# Patient Record
Sex: Male | Born: 1950 | Race: White | Hispanic: No | Marital: Single | Smoking: Never smoker
Health system: Southern US, Community
[De-identification: ages and names within clinical notes are randomized; demographics above are authoritative.]

## PROBLEM LIST (undated history)

## (undated) DIAGNOSIS — J45909 Unspecified asthma, uncomplicated: Secondary | ICD-10-CM

## (undated) DIAGNOSIS — J939 Pneumothorax, unspecified: Secondary | ICD-10-CM

## (undated) DIAGNOSIS — R011 Cardiac murmur, unspecified: Secondary | ICD-10-CM

## (undated) DIAGNOSIS — C61 Malignant neoplasm of prostate: Secondary | ICD-10-CM

## (undated) DIAGNOSIS — K219 Gastro-esophageal reflux disease without esophagitis: Secondary | ICD-10-CM

## (undated) HISTORY — PX: EYE SURGERY: SHX253

## (undated) HISTORY — DX: Gastro-esophageal reflux disease without esophagitis: K21.9

## (undated) HISTORY — PX: LUNG SURGERY: SHX703

## (undated) HISTORY — PX: PROSTATE BIOPSY: SHX241

## (undated) HISTORY — DX: Unspecified asthma, uncomplicated: J45.909

---

## 1999-04-20 DIAGNOSIS — J939 Pneumothorax, unspecified: Secondary | ICD-10-CM

## 1999-04-20 HISTORY — PX: LUNG SURGERY: SHX703

## 1999-04-20 HISTORY — DX: Pneumothorax, unspecified: J93.9

## 2014-07-04 ENCOUNTER — Other Ambulatory Visit (HOSPITAL_COMMUNITY): Payer: Self-pay | Admitting: Family Medicine

## 2014-07-04 ENCOUNTER — Ambulatory Visit (HOSPITAL_COMMUNITY)
Admission: RE | Admit: 2014-07-04 | Discharge: 2014-07-04 | Disposition: A | Discharge status: Home / Self Care | Payer: Disability Insurance | Source: Ambulatory Visit | Attending: Family Medicine | Admitting: Family Medicine

## 2014-07-04 DIAGNOSIS — M542 Cervicalgia: Secondary | ICD-10-CM

## 2014-07-04 DIAGNOSIS — M5032 Other cervical disc degeneration, mid-cervical region: Secondary | ICD-10-CM | POA: Insufficient documentation

## 2014-07-04 DIAGNOSIS — M25512 Pain in left shoulder: Secondary | ICD-10-CM | POA: Insufficient documentation

## 2014-07-04 DIAGNOSIS — M25511 Pain in right shoulder: Secondary | ICD-10-CM

## 2019-03-31 ENCOUNTER — Emergency Department (HOSPITAL_COMMUNITY): Payer: Medicare Other

## 2019-03-31 ENCOUNTER — Encounter (HOSPITAL_COMMUNITY): Payer: Self-pay | Admitting: Emergency Medicine

## 2019-03-31 ENCOUNTER — Emergency Department (HOSPITAL_COMMUNITY)
Admission: EM | Admit: 2019-03-31 | Discharge: 2019-03-31 | Disposition: A | Discharge status: Home / Self Care | Payer: Medicare Other | Attending: Emergency Medicine | Admitting: Emergency Medicine

## 2019-03-31 ENCOUNTER — Other Ambulatory Visit: Payer: Self-pay

## 2019-03-31 DIAGNOSIS — W540XXA Bitten by dog, initial encounter: Secondary | ICD-10-CM | POA: Diagnosis not present

## 2019-03-31 DIAGNOSIS — Z2914 Encounter for prophylactic rabies immune globin: Secondary | ICD-10-CM | POA: Insufficient documentation

## 2019-03-31 DIAGNOSIS — Y939 Activity, unspecified: Secondary | ICD-10-CM | POA: Diagnosis not present

## 2019-03-31 DIAGNOSIS — Z23 Encounter for immunization: Secondary | ICD-10-CM | POA: Diagnosis not present

## 2019-03-31 DIAGNOSIS — Y929 Unspecified place or not applicable: Secondary | ICD-10-CM | POA: Diagnosis not present

## 2019-03-31 DIAGNOSIS — Z203 Contact with and (suspected) exposure to rabies: Secondary | ICD-10-CM | POA: Insufficient documentation

## 2019-03-31 DIAGNOSIS — S51832A Puncture wound without foreign body of left forearm, initial encounter: Secondary | ICD-10-CM | POA: Insufficient documentation

## 2019-03-31 DIAGNOSIS — S51831A Puncture wound without foreign body of right forearm, initial encounter: Secondary | ICD-10-CM | POA: Diagnosis not present

## 2019-03-31 DIAGNOSIS — R0789 Other chest pain: Secondary | ICD-10-CM | POA: Diagnosis not present

## 2019-03-31 DIAGNOSIS — Y999 Unspecified external cause status: Secondary | ICD-10-CM | POA: Insufficient documentation

## 2019-03-31 DIAGNOSIS — T148XXA Other injury of unspecified body region, initial encounter: Secondary | ICD-10-CM

## 2019-03-31 HISTORY — DX: Pneumothorax, unspecified: J93.9

## 2019-03-31 LAB — CBC WITH DIFFERENTIAL/PLATELET
Abs Immature Granulocytes: 0.01 10*3/uL (ref 0.00–0.07)
Basophils Absolute: 0.1 10*3/uL (ref 0.0–0.1)
Basophils Relative: 1 %
Eosinophils Absolute: 0.4 10*3/uL (ref 0.0–0.5)
Eosinophils Relative: 5 %
HCT: 47.7 % (ref 39.0–52.0)
Hemoglobin: 15.4 g/dL (ref 13.0–17.0)
Immature Granulocytes: 0 %
Lymphocytes Relative: 23 %
Lymphs Abs: 1.6 10*3/uL (ref 0.7–4.0)
MCH: 27.8 pg (ref 26.0–34.0)
MCHC: 32.3 g/dL (ref 30.0–36.0)
MCV: 86.1 fL (ref 80.0–100.0)
Monocytes Absolute: 0.5 10*3/uL (ref 0.1–1.0)
Monocytes Relative: 8 %
Neutro Abs: 4.5 10*3/uL (ref 1.7–7.7)
Neutrophils Relative %: 63 %
Platelets: 270 10*3/uL (ref 150–400)
RBC: 5.54 MIL/uL (ref 4.22–5.81)
RDW: 13 % (ref 11.5–15.5)
WBC: 7.1 10*3/uL (ref 4.0–10.5)
nRBC: 0 % (ref 0.0–0.2)

## 2019-03-31 LAB — BASIC METABOLIC PANEL
Anion gap: 9 (ref 5–15)
BUN: 13 mg/dL (ref 8–23)
CO2: 23 mmol/L (ref 22–32)
Calcium: 9.3 mg/dL (ref 8.9–10.3)
Chloride: 103 mmol/L (ref 98–111)
Creatinine, Ser: 0.9 mg/dL (ref 0.61–1.24)
GFR calc Af Amer: >60 mL/min (ref >60)
GFR calc non Af Amer: >60 mL/min (ref >60)
Glucose, Bld: 95 mg/dL (ref 70–99)
Potassium: 4.1 mmol/L (ref 3.5–5.1)
Sodium: 135 mmol/L (ref 135–145)

## 2019-03-31 LAB — TROPONIN I (HIGH SENSITIVITY)
Troponin I (High Sensitivity): 5 ng/L (ref <18)
Troponin I (High Sensitivity): 5 ng/L (ref <18)

## 2019-03-31 LAB — D-DIMER, QUANTITATIVE: D-Dimer, Quant: 0.52 ug/mL-FEU — ABNORMAL HIGH (ref 0.00–0.50)

## 2019-03-31 MED ORDER — TETANUS-DIPHTH-ACELL PERTUSSIS 5-2.5-18.5 LF-MCG/0.5 IM SUSP
0.5000 mL | Freq: Once | INTRAMUSCULAR | Status: AC
  Administered 2019-03-31: 12:00:00 0.5 mL via INTRAMUSCULAR
  Filled 2019-03-31: qty 0.5

## 2019-03-31 MED ORDER — AMOXICILLIN-POT CLAVULANATE 875-125 MG PO TABS
1.0000 | ORAL_TABLET | Freq: Once | ORAL | Status: AC
  Administered 2019-03-31: 1 via ORAL
  Filled 2019-03-31: qty 1

## 2019-03-31 MED ORDER — AMOXICILLIN-POT CLAVULANATE 875-125 MG PO TABS: 1.0000 | ORAL_TABLET | Freq: Two times a day (BID) | ORAL | 0 refills | Status: DC

## 2019-03-31 MED ORDER — RABIES IMMUNE GLOBULIN 150 UNIT/ML IM INJ
20.0000 [IU]/kg | INJECTION | Freq: Once | INTRAMUSCULAR | Status: AC
  Administered 2019-03-31: 12:00:00 2100 [IU] via INTRAMUSCULAR
  Filled 2019-03-31: qty 14

## 2019-03-31 MED ORDER — RABIES VACCINE, PCEC IM SUSR
1.0000 mL | Freq: Once | INTRAMUSCULAR | Status: AC
  Administered 2019-03-31: 12:00:00 1 mL via INTRAMUSCULAR
  Filled 2019-03-31: qty 1

## 2019-03-31 NOTE — ED Notes (Signed)
Awaiting trop results

## 2019-03-31 NOTE — ED Notes (Signed)
Pt reports he waited 2 hours yesterday at [facility] and left  Was moving a shed and a pitbull "mauled" him   dressings in place to bilateral arms  Pt in NAD   States awakened w CP this am, NSR on monitor and normal EKG

## 2019-03-31 NOTE — ED Notes (Addendum)
Pt reports animal bit him at [address] , [place], [place] control notified in [place] thru [place] has wounds to bilateral forearms - a long superficial scratch to the left forearm with a puncture wound to upper forearm and lower forearm   As well as a single puncture also to the R forearm upper and lower as if dog bit both forearms with penetration of upper and lower tooth (singular) to each arm

## 2019-03-31 NOTE — Discharge Instructions (Signed)
Take the antibiotics as prescribed.  There is no evidence of damage to your heart and your pain is likely coming from your chest wall.  Follow-up with your primary doctor or urgent care for your repeat rabies vaccinations as needed by the schedule below.  Take the antibiotics as prescribed.  Return to the ED with new or worsening symptoms.                                    RABIES VACCINE FOLLOW UP  Patient's Name: Leon Hawkins                     Original Order Date:03/31/2019  Medical Record Number: [id number]  ED Physician: Ezequiel Essex, MD Primary Diagnosis: Rabies Exposure       PCP: Patient, No Pcp Per  Patient Phone Number: (home) [phone] (home)    (cell)  No relevant phone numbers on file.    (work) There is no work phone number on file. Species of Animal:     You have been seen in the Emergency Department for a possible rabies exposure. It's very important you return for the additional vaccine doses.  Please call the clinic listed below for hours of operation.   Clinic that will administer your rabies vaccines:    DAY 0:  03/31/2019      DAY 3:  04/03/2019       DAY 7:  04/07/2019     DAY 14:  04/14/2019         The 5th vaccine injection is considered for immune compromised patients only.  DAY 28:  04/28/2019

## 2019-03-31 NOTE — ED Provider Notes (Signed)
[facility] EMERGENCY DEPARTMENT Provider Note   CSN: [id number] Arrival date & time: 03/31/19  1052     History Chief Complaint  Patient presents with  . Animal Bite    Leon Hawkins is a 68 y.o. male.  Patient here with animal bites to his bilateral arms.  States he was "mauledby a Dealer" yesterday around noon.  He went to an outside hospital where he waited for several hours and left without being seen.  He came in today because he developed some left-sided chest pain overnight in the left side of his chest that has been fairly constant.  Is worse with palpation.  It does not radiate.  There is no associated shortness of breath, cough or fever.  No abdominal pain, nausea, vomiting. Patient denies any cardiac history.  He states he has pneumothorax remotely from a chemical exposure.  He is never had a heart attack.  The pain is in the left side of his chest worse with palpation.  There is no associated shortness of breath, fever, cough, nausea or dizziness.  He is not up-to-date on his tetanus shot.  He states he did speak to the owner of the pit bull and the dog is not up-to-date on rabies.  Animal control was not alerted.  He has multiple wounds to his bilateral forearms with bleeding controlled.  No focal weakness, numbness, tingling.  The history is provided by the patient.  Animal Bite Associated symptoms: no fever        Past Medical History:  Diagnosis Date  . Pneumothorax     There are no problems to display for this patient.   Past Surgical History:  Procedure Laterality Date  . LUNG SURGERY         Family History  Problem Relation Age of Onset  . Cancer Mother   . Heart failure Father   . Hypertension Father   . Diabetes Sister     Social History   Tobacco Use  . Smoking status: Never Smoker  . Smokeless tobacco: Never Used  Substance Use Topics  . Alcohol use: Never  . Drug use: Never    Home Medications Prior to Admission medications     Not on File    Allergies    Patient has no known allergies.  Review of Systems   Review of Systems  Constitutional: Negative for activity change, appetite change and fever.  HENT: Negative for congestion and rhinorrhea.   Respiratory: Positive for chest tightness. Negative for shortness of breath.   Cardiovascular: Positive for chest pain.  Gastrointestinal: Negative for abdominal pain, nausea and vomiting.  Genitourinary: Negative for dysuria and hematuria.  Musculoskeletal: Positive for arthralgias and myalgias.  Skin: Positive for wound.  Neurological: Negative for dizziness, weakness and headaches.   all other systems are negative except as noted in the HPI and PMH.    Physical Exam Updated Vital Signs BP (!) 165/111 (BP Location: Left Arm)   Pulse 64   Temp 97.9 F (36.6 C) (Oral)   Resp (!) 24   Ht 5\' 10"  (1.778 m)   Wt 106.6 kg   SpO2 99%   BMI 33.72 kg/m   Physical Exam Vitals and nursing note reviewed.  Constitutional:      General: He is not in acute distress.    Appearance: He is well-developed. He is obese.  HENT:     Head: Normocephalic and atraumatic.     Mouth/Throat:     Pharynx: No oropharyngeal exudate.  Eyes:     Conjunctiva/sclera: Conjunctivae normal.     Pupils: Pupils are equal, round, and reactive to light.  Neck:     Comments: No meningismus. Cardiovascular:     Rate and Rhythm: Normal rate and regular rhythm.     Heart sounds: Normal heart sounds. No murmur.  Pulmonary:     Effort: Pulmonary effort is normal. No respiratory distress.     Breath sounds: Normal breath sounds.     Comments: Reproducible left chest wall tenderness Chest:     Chest wall: Tenderness present.  Abdominal:     Palpations: Abdomen is soft.     Tenderness: There is no abdominal tenderness. There is no guarding or rebound.  Musculoskeletal:        General: Tenderness and signs of injury present. Normal range of motion.     Cervical back: Normal range of  motion and neck supple.     Comments: Bilateral forearms with multiple puncture wounds as depicted.  Bleeding is controlled.  Full range of motion of all fingers, bilateral wrists and elbows. Radial pulses are intact.  Skin:    General: Skin is warm.     Capillary Refill: Capillary refill takes less than 2 seconds.  Neurological:     General: No focal deficit present.     Mental Status: He is alert and oriented to person, place, and time. Mental status is at baseline.     Cranial Nerves: No cranial nerve deficit.     Motor: No abnormal muscle tone.     Coordination: Coordination normal.     Comments: No ataxia on finger to nose bilaterally. No pronator drift. 5/5 strength throughout. CN 2-12 intact.Equal grip strength. Sensation intact.   Psychiatric:        Behavior: Behavior normal.           ED Results / Procedures / Treatments   Labs (all labs ordered are listed, but only abnormal results are displayed) Labs Reviewed  D-DIMER, QUANTITATIVE (NOT AT [facility]) - Abnormal; Notable for the following components:      Result Value   D-Dimer, Quant 0.52 (*)    All other components within normal limits  CBC WITH DIFFERENTIAL/PLATELET  BASIC METABOLIC PANEL  TROPONIN I (HIGH SENSITIVITY)  TROPONIN I (HIGH SENSITIVITY)    EKG EKG Interpretation  Date/Time:  Saturday March 31 2019 11:24:04 EST Ventricular Rate:  63 PR Interval:    QRS Duration: 93 QT Interval:  402 QTC Calculation: 412 R Axis:   18 Text Interpretation: Sinus rhythm No previous ECGs available Confirmed by Ezequiel Essex [id number]) on 03/31/2019 11:28:56 AM   Radiology DG Forearm Left  Result Date: 03/31/2019 CLINICAL DATA:  Dog bite to the forearm. EXAM: LEFT FOREARM - 2 VIEW COMPARISON:  None. FINDINGS: There is no evidence of fracture or other focal bone lesions. Soft tissues are unremarkable. IMPRESSION: Negative radiographs of the left forearm. Electronically Signed   By: Audie Pinto M.D.   On:  03/31/2019 12:13   DG Forearm Right  Result Date: 03/31/2019 CLINICAL DATA:  Dog bite to the forearm. EXAM: RIGHT FOREARM - 2 VIEW COMPARISON:  None. FINDINGS: There is no evidence of fracture or other focal bone lesions. There is a small soft tissue defect in the mid aspect of the forearm which may correspond to the patient's dog bite. No unexpected radiopaque foreign body identified. IMPRESSION: Soft tissue defect in the mid aspect of the forearm without evidence of underlying bony abnormality. No unexpected radiopaque foreign  body. Electronically Signed   By: Audie Pinto M.D.   On: 03/31/2019 12:11   DG Chest Portable 1 View  Result Date: 03/31/2019 CLINICAL DATA:  Dog bite. Midsternal migrating Robin in the chest. EXAM: PORTABLE CHEST 1 VIEW COMPARISON:  None. FINDINGS: The heart size and mediastinal contours are within normal limits. There are mild linear opacities in the bilateral lung bases which could reflect atelectasis or scarring. No focal opacities to suggest infection. No pneumothorax or large pleural effusion. No acute finding in the visualized skeleton. The visualized skeletal structures are unremarkable. IMPRESSION: Mild linear opacities in the bilateral lung bases may reflect atelectasis or scarring. Electronically Signed   By: Audie Pinto M.D.   On: 03/31/2019 12:10    Procedures Procedures (including critical care time)  Medications Ordered in ED Medications  Tdap (BOOSTRIX) injection 0.5 mL (0.5 mLs Intramuscular Given 03/31/19 1211)  amoxicillin-clavulanate (AUGMENTIN) 875-125 MG per tablet 1 tablet (1 tablet Oral Given 03/31/19 1211)  rabies immune globulin (HYPERAB/KEDRAB) injection 2,100 Units (2,100 Units Intramuscular Given 03/31/19 1219)  rabies vaccine (RABAVERT) injection 1 mL (1 mL Intramuscular Given 03/31/19 1218)    ED Course  I have reviewed the triage vital signs and the nursing notes.  Pertinent labs & imaging results that were available  during my care of the patient were reviewed by me and considered in my medical decision making (see chart for details).    MDM Rules/Calculators/A&P     CHA2DS2/VAS Stroke Risk Points      N/A >= 2 Points: High Risk  1 - 1.99 Points: Medium Risk  0 Points: Low Risk    A final score could not be computed because of missing components.: Last  Change: N/A     This score determines the patient's risk of having a stroke if the  patient has atrial fibrillation.      This score is not applicable to this patient. Components are not  calculated.                  Animal bite last night.  Developed chest pain this morning.  Patient's chest pain is pinpoint and reproducible.  EKG is sinus rhythm.  Low suspicion for ACS or PE.  Wounds will be cleaned.  X-rays will be obtained, tetanus prophylaxis will be given as well as rabies prophylaxis after risks and benefits discussed.  Patient states the dog's rabies status is not up-to-date.  Wounds cleaned.  X-rays are negative for foreign body.  Chest x-ray is negative.  Low suspicion for ACS.  Age-adjusted D-dimer is negative.  Troponin negative x2. Low suspicion for ACS or PE. Pain is reproducible.  Wounds cleaned. Tetanus updated, antibiotics begun. Animal control notified per nursing.  Schedule for possible rabies exposure prophylaxis given.   Return precautions discussed.   Final Clinical Impression(s) / ED Diagnoses Final diagnoses:  Animal bite  Atypical chest pain    Rx / DC Orders ED Discharge Orders         Ordered    amoxicillin-clavulanate (AUGMENTIN) 875-125 MG tablet  Every 12 hours     03/31/19 1518           Ezequiel Essex, MD 03/31/19 1945

## 2019-03-31 NOTE — ED Notes (Signed)
Wounds cleansed   Dressing applied

## 2019-03-31 NOTE — ED Triage Notes (Addendum)
Patient "mauled by pitbull yesterday." Patient went to [facility] ER in [place] for rabies vaccination and tetanus vaccination because dog's shots were not up to date. Animal control was contacted yesterday. Patient states waited for hours in ED and was not seen. Patient states he returned home without vaccinations. Patient woke this morning with chest pain and throbbing in arms. Patient states mid-sternal, non-radiating throbbing in chest.  Per patient shortness of breath but no dizziness, nausea, vomiting, or weakness. Denies any fevers. Dog bites to arms bilaterally. Patient has gauze dressing over animal bite. Dressings clean, dry, and intact.

## 2019-04-04 ENCOUNTER — Ambulatory Visit
Admission: EM | Admit: 2019-04-04 | Discharge: 2019-04-04 | Disposition: A | Discharge status: Home / Self Care | Payer: Medicare Other | Attending: Emergency Medicine | Admitting: Emergency Medicine

## 2019-04-04 DIAGNOSIS — Z203 Contact with and (suspected) exposure to rabies: Secondary | ICD-10-CM | POA: Diagnosis not present

## 2019-04-04 DIAGNOSIS — Z23 Encounter for immunization: Secondary | ICD-10-CM

## 2019-04-04 MED ORDER — RABIES VACCINE, PCEC IM SUSR
1.0000 mL | Freq: Once | INTRAMUSCULAR | Status: AC
  Administered 2019-04-04: 1 mL via INTRAMUSCULAR

## 2019-04-04 NOTE — ED Triage Notes (Signed)
Pt presents to UC for follow up rabies vaccine. Pt is on day 4. Pt is instructed to come back at day 7

## 2020-04-16 ENCOUNTER — Encounter: Payer: Self-pay | Admitting: *Deleted

## 2020-05-13 ENCOUNTER — Other Ambulatory Visit: Payer: Self-pay

## 2020-05-13 ENCOUNTER — Ambulatory Visit (INDEPENDENT_AMBULATORY_CARE_PROVIDER_SITE_OTHER): Payer: Self-pay | Admitting: *Deleted

## 2020-05-13 VITALS — Ht 70.0 in | Wt 248.2 lb

## 2020-05-13 DIAGNOSIS — Z1211 Encounter for screening for malignant neoplasm of colon: Secondary | ICD-10-CM

## 2020-05-13 MED ORDER — PEG 3350-KCL-NA BICARB-NACL 420 G PO SOLR: 4000.0000 mL | Freq: Once | ORAL | 0 refills | Status: AC

## 2020-05-13 NOTE — Patient Instructions (Signed)
Leon Hawkins   May 18, 1950 MRN: [id number]    Procedure Date: 06/13/2020 Time to register: 6:30 am Place to register: [facility] Procedure Time: 8:00 am Scheduled provider: Dr. Abbey Chatters  PREPARATION FOR COLONOSCOPY WITH TRI-LYTE SPLIT PREP  Please notify us immediately if you are diabetic, take iron supplements, or if you are on Coumadin or any other blood thinners.   Please hold the following medications: n/a  You will need to purchase 1 fleet enema and 1 box of Bisacodyl $RemoveBefo'5mg'WkoXkMMmZhB$  tablets.   2 DAYS BEFORE PROCEDURE:  DATE: 06/11/2020   DAY: Wednesday Begin clear liquid diet AFTER your lunch meal. NO SOLID FOODS after this point.  1 DAY BEFORE PROCEDURE:  DATE: 06/12/2020   DAY: Thursday Continue clear liquids the entire day - NO SOLID FOOD.   Diabetic medications adjustments for today: n/a  At 2:00 pm:  Take 2 Bisacodyl tablets.   At 4:00pm:  Start drinking your solution. Make sure you mix well per instructions on the bottle. Try to drink 1 (one) 8 ounce glass every 10-15 minutes until you have consumed HALF the jug. You should complete by 6:00pm.You must keep the left over solution refrigerated until completed next day.  Continue clear liquids. You must drink plenty of clear liquids to prevent dehyration and kidney failure.     DAY OF PROCEDURE:   DATE: 06/13/2020   DAY: Friday If you take medications for your heart, blood pressure or breathing, you may take these medications.  Diabetic medications adjustments for today: n/a  Five hours before your procedure time @ 3:00 am:  Finish remaining amout of bowel prep, drinking 1 (one) 8 ounce glass every 10-15 minutes until complete. You have two hours to consume remaining prep.   Three hours before your procedure time @ 5:00 am:  Nothing by mouth.   At least one hour before going to the hospital:  Give yourself one Fleet enema. You may take your morning medications with sip of water unless we have instructed otherwise.       Please see below for Dietary Information.  CLEAR LIQUIDS INCLUDE:  Water Jello (NOT red in color)   Ice Popsicles (NOT red in color)   Tea (sugar ok, no milk/cream) Powdered fruit flavored drinks  Coffee (sugar ok, no milk/cream) Gatorade/ Lemonade/ Kool-Aid  (NOT red in color)   Juice: apple, white grape, white cranberry Soft drinks  Clear bullion, consomme, broth (fat free beef/chicken/vegetable)  Carbonated beverages (any kind)  Strained chicken noodle soup Hard Candy   Remember: Clear liquids are liquids that will allow you to see your fingers on the other side of a clear glass. Be sure liquids are NOT red in color, and not cloudy, but CLEAR.  DO NOT EAT OR DRINK ANY OF THE FOLLOWING:  Dairy products of any kind   Cranberry juice Tomato juice / V8 juice   Grapefruit juice Orange juice     Red grape juice  Do not eat any solid foods, including such foods as: cereal, oatmeal, yogurt, fruits, vegetables, creamed soups, eggs, bread, crackers, pureed foods in a blender, etc.   HELPFUL HINTS FOR DRINKING PREP SOLUTION:   Make sure prep is extremely cold. Mix and refrigerate the the morning of the prep. You may also put in the freezer.   You may try mixing some Crystal Light or Country Time Lemonade if you prefer. Mix in small amounts; add more if necessary.  Try drinking through a straw  Rinse mouth with water or  a mouthwash between glasses, to remove after-taste.  Try sipping on a cold beverage /ice/ popsicles between glasses of prep.  Place a piece of sugar-free hard candy in mouth between glasses.  If you become nauseated, try consuming smaller amounts, or stretch out the time between glasses. Stop for 30-60 minutes, then slowly start back drinking.        OTHER INSTRUCTIONS  You will need a responsible adult at least 71 years of age to accompany you and drive you home. This person must remain in the waiting room during your procedure. The hospital will cancel  your procedure if you do not have a responsible adult with you.   1. Wear loose fitting clothing that is easily removed. 2. Leave jewelry and other valuables at home.  3. Remove all body piercing jewelry and leave at home. 4. Total time from sign-in until discharge is approximately 2-3 hours. 5. You should go home directly after your procedure and rest. You can resume normal activities the day after your procedure. 6. The day of your procedure you should not:  Drive  Make legal decisions  Operate machinery  Drink alcohol  Return to work   You may call the office (Dept: [phone]) before 5:00pm, or page the doctor on call [phone]) after 5:00pm, for further instructions, if necessary.   Insurance Information YOU WILL NEED TO CHECK WITH YOUR INSURANCE COMPANY FOR THE BENEFITS OF COVERAGE YOU HAVE FOR THIS PROCEDURE.  UNFORTUNATELY, NOT ALL INSURANCE COMPANIES HAVE BENEFITS TO COVER ALL OR PART OF THESE TYPES OF PROCEDURES.  IT IS YOUR RESPONSIBILITY TO CHECK YOUR BENEFITS, HOWEVER, WE WILL BE GLAD TO ASSIST YOU WITH ANY CODES YOUR INSURANCE COMPANY MAY NEED.    PLEASE NOTE THAT MOST INSURANCE COMPANIES WILL NOT COVER A SCREENING COLONOSCOPY FOR PEOPLE UNDER THE AGE OF 50  IF YOU HAVE BCBS INSURANCE, YOU MAY HAVE BENEFITS FOR A SCREENING COLONOSCOPY BUT IF POLYPS ARE FOUND THE DIAGNOSIS WILL CHANGE AND THEN YOU MAY HAVE A DEDUCTIBLE THAT WILL NEED TO BE MET. SO PLEASE MAKE SURE YOU CHECK YOUR BENEFITS FOR A SCREENING COLONOSCOPY AS WELL AS A DIAGNOSTIC COLONOSCOPY.

## 2020-05-13 NOTE — Progress Notes (Signed)
Gastroenterology Pre-Procedure Review  Request Date: 05/13/2020 Requesting Physician: Dr. Sherrie Sport, no previous TCS  PATIENT REVIEW QUESTIONS: The patient responded to the following health history questions as indicated:    1. Diabetes Melitis: no 2. Joint replacements in the past 12 months: no 3. Major health problems in the past 3 months: no 4. Has an artificial valve or MVP: no 5. Has a defibrillator: no 6. Has been advised in past to take antibiotics in advance of a procedure like teeth cleaning: no 7. Family history of colon cancer: no  8. Alcohol Use: no 9. Illicit drug Use: no 10. History of sleep apnea: no  11. History of coronary artery or other vascular stents placed within the last 12 months: no 12. History of any prior anesthesia complications: yes, work up during procedure ( pneumothorax surgery done in [place]) 13. Body mass index is 35.61 kg/m.    MEDICATIONS & ALLERGIES:    Patient reports the following regarding taking any blood thinners:   Plavix? no Aspirin? yes Coumadin? no Brilinta? no Xarelto? no Eliquis? no Pradaxa? no Savaysa? no Effient? no  Patient confirms/reports the following medications:  Current Outpatient Medications  Medication Sig Dispense Refill  . aspirin EC 81 MG tablet Take 81 mg by mouth daily. Swallow whole.    . Pyridoxine HCl (VITAMIN B6) 200 MG TABS Take by mouth. Takes 500 mg daily.    . vitamin B-12 (CYANOCOBALAMIN) 500 MCG tablet Take 500 mcg by mouth daily.     No current facility-administered medications for this visit.    Patient confirms/reports the following allergies:  Allergies  Allergen Reactions  . Latex Rash    Elevates blood pressure    No orders of the defined types were placed in this encounter.   AUTHORIZATION INFORMATION Primary Insurance: Medicare,  ID #: [id number] Pre-Cert / Auth required: No, not required  Secondary Insurance: Medicaid,  ID #: [id number] Pre-Cert / Josem Kaufmann required: No, not  required  SCHEDULE INFORMATION: Procedure has been scheduled as follows:  Date: 06/13/2020, Time: 8:00 Location: [facility] with Dr. Abbey Chatters  This Gastroenterology Pre-Precedure Review Form is being routed to the following provider(s): Neil Crouch, PA

## 2020-05-15 NOTE — Progress Notes (Signed)
Ok to schedule. ASA I. 

## 2020-05-26 ENCOUNTER — Ambulatory Visit: Payer: Medicare Other | Admitting: Urology

## 2020-05-26 DIAGNOSIS — R972 Elevated prostate specific antigen [PSA]: Secondary | ICD-10-CM

## 2020-06-09 ENCOUNTER — Encounter (HOSPITAL_COMMUNITY): Payer: Self-pay | Admitting: *Deleted

## 2020-06-11 ENCOUNTER — Other Ambulatory Visit (HOSPITAL_COMMUNITY)
Admission: RE | Admit: 2020-06-11 | Discharge: 2020-06-11 | Disposition: A | Discharge status: Home / Self Care | Payer: Medicare Other | Source: Ambulatory Visit | Attending: Internal Medicine | Admitting: Internal Medicine

## 2020-06-11 ENCOUNTER — Other Ambulatory Visit: Payer: Self-pay

## 2020-06-11 DIAGNOSIS — Z20822 Contact with and (suspected) exposure to covid-19: Secondary | ICD-10-CM | POA: Insufficient documentation

## 2020-06-11 DIAGNOSIS — Z01812 Encounter for preprocedural laboratory examination: Secondary | ICD-10-CM | POA: Insufficient documentation

## 2020-06-11 LAB — SARS CORONAVIRUS 2 (TAT 6-24 HRS): SARS Coronavirus 2: NEGATIVE

## 2020-06-13 ENCOUNTER — Encounter (HOSPITAL_COMMUNITY)
Admission: RE | Disposition: A | Discharge status: Home / Self Care | Payer: Self-pay | Source: Home / Self Care | Attending: Internal Medicine

## 2020-06-13 ENCOUNTER — Ambulatory Visit (HOSPITAL_COMMUNITY): Payer: Medicare Other | Admitting: Certified Registered Nurse Anesthetist

## 2020-06-13 ENCOUNTER — Other Ambulatory Visit: Payer: Self-pay

## 2020-06-13 ENCOUNTER — Ambulatory Visit (HOSPITAL_COMMUNITY)
Admission: RE | Admit: 2020-06-13 | Discharge: 2020-06-13 | Disposition: A | Discharge status: Home / Self Care | Payer: Medicare Other | Attending: Internal Medicine | Admitting: Internal Medicine

## 2020-06-13 ENCOUNTER — Encounter (HOSPITAL_COMMUNITY): Payer: Self-pay

## 2020-06-13 DIAGNOSIS — Z79899 Other long term (current) drug therapy: Secondary | ICD-10-CM | POA: Insufficient documentation

## 2020-06-13 DIAGNOSIS — Z91040 Latex allergy status: Secondary | ICD-10-CM | POA: Diagnosis not present

## 2020-06-13 DIAGNOSIS — K648 Other hemorrhoids: Secondary | ICD-10-CM | POA: Diagnosis not present

## 2020-06-13 DIAGNOSIS — Z1211 Encounter for screening for malignant neoplasm of colon: Secondary | ICD-10-CM | POA: Diagnosis present

## 2020-06-13 DIAGNOSIS — K5730 Diverticulosis of large intestine without perforation or abscess without bleeding: Secondary | ICD-10-CM | POA: Diagnosis not present

## 2020-06-13 DIAGNOSIS — Z7982 Long term (current) use of aspirin: Secondary | ICD-10-CM | POA: Diagnosis not present

## 2020-06-13 DIAGNOSIS — K635 Polyp of colon: Secondary | ICD-10-CM | POA: Diagnosis not present

## 2020-06-13 HISTORY — PX: POLYPECTOMY: SHX5525

## 2020-06-13 HISTORY — PX: COLONOSCOPY WITH PROPOFOL: SHX5780

## 2020-06-13 SURGERY — COLONOSCOPY WITH PROPOFOL
Anesthesia: General

## 2020-06-13 MED ORDER — CHLORHEXIDINE GLUCONATE CLOTH 2 % EX PADS: 6.0000 | MEDICATED_PAD | Freq: Once | CUTANEOUS | Status: DC

## 2020-06-13 MED ORDER — STERILE WATER FOR IRRIGATION IR SOLN
Status: DC | PRN
  Administered 2020-06-13: 100 mL

## 2020-06-13 MED ORDER — PROPOFOL 500 MG/50ML IV EMUL
INTRAVENOUS | Status: DC | PRN
  Administered 2020-06-13: 150 ug/kg/min via INTRAVENOUS

## 2020-06-13 MED ORDER — LACTATED RINGERS IV SOLN: INTRAVENOUS | Status: DC

## 2020-06-13 MED ORDER — PROPOFOL 10 MG/ML IV BOLUS
INTRAVENOUS | Status: DC | PRN
Start: 2020-06-13 — End: 2020-06-13
  Administered 2020-06-13: 80 mg via INTRAVENOUS

## 2020-06-13 MED ORDER — LACTATED RINGERS IV SOLN: INTRAVENOUS | Status: DC | PRN

## 2020-06-13 NOTE — Anesthesia Preprocedure Evaluation (Signed)
Anesthesia Evaluation  Patient identified by MRN, date of birth, ID band Patient awake    Reviewed: Allergy & Precautions, H&P , NPO status , Patient's Chart, lab work & pertinent test results, reviewed documented beta blocker date and time   Airway Mallampati: I  TM Distance: >3 FB Neck ROM: full    Dental no notable dental hx.    Pulmonary neg pulmonary ROS,    Pulmonary exam normal breath sounds clear to auscultation       Cardiovascular Exercise Tolerance: Good negative cardio ROS   Rhythm:regular Rate:Normal     Neuro/Psych negative neurological ROS  negative psych ROS   GI/Hepatic negative GI ROS, Neg liver ROS,   Endo/Other  negative endocrine ROS  Renal/GU negative Renal ROS  negative genitourinary   Musculoskeletal   Abdominal   Peds  Hematology negative hematology ROS (+)   Anesthesia Other Findings   Reproductive/Obstetrics negative OB ROS                             Anesthesia Physical Anesthesia Plan  ASA: II  Anesthesia Plan: General   Post-op Pain Management:    Induction:   PONV Risk Score and Plan: Propofol infusion  Airway Management Planned:   Additional Equipment:   Intra-op Plan:   Post-operative Plan:   Informed Consent: I have reviewed the patients History and Physical, chart, labs and discussed the procedure including the risks, benefits and alternatives for the proposed anesthesia with the patient or authorized representative who has indicated his/her understanding and acceptance.     Dental Advisory Given  Plan Discussed with: CRNA  Anesthesia Plan Comments:         Anesthesia Quick Evaluation  

## 2020-06-13 NOTE — H&P (Signed)
Primary Care Physician:  Neale Burly, MD Primary Gastroenterologist:  Dr. Abbey Chatters  Pre-Procedure History & Physical: HPI:  Leon Hawkins is a 70 y.o. male is here for a colonoscopy for colon cancer screening purposes.  Patient denies any family history of colorectal cancer.  No melena or hematochezia.  No abdominal pain or unintentional weight loss.  No change in bowel habits.  Overall feels well from a GI standpoint.  Past Medical History:  Diagnosis Date  . Pneumothorax     Past Surgical History:  Procedure Laterality Date  . LUNG SURGERY      Prior to Admission medications   Medication Sig Start Date End Date Taking? Authorizing Provider  aspirin EC 81 MG tablet Take 81 mg by mouth daily. Swallow whole.   Yes [provider]  Pyridoxine HCl (VITAMIN B6) 200 MG TABS Take 200 mg by mouth daily. Takes 500 mg daily.   Yes [provider]  vitamin B-12 (CYANOCOBALAMIN) 500 MCG tablet Take 500 mcg by mouth daily.   Yes [provider]  albuterol (VENTOLIN HFA) 108 (90 Base) MCG/ACT inhaler Inhale 1-2 puffs into the lungs every 6 (six) hours as needed for wheezing or shortness of breath.    [provider]    Allergies as of 05/15/2020 - Review Complete 05/13/2020  Allergen Reaction Noted  . Latex Rash 05/13/2020    Family History  Problem Relation Age of Onset  . Cancer Mother   . Heart failure Father   . Hypertension Father   . Diabetes Sister     Social History   Socioeconomic History  . Marital status: Legally Separated    Spouse name: Not on file  . Number of children: Not on file  . Years of education: Not on file  . Highest education level: Not on file  Occupational History  . Not on file  Tobacco Use  . Smoking status: Never Smoker  . Smokeless tobacco: Never Used  Vaping Use  . Vaping Use: Not on file  Substance and Sexual Activity  . Alcohol use: Never  . Drug use: Never  . Sexual activity: Not on file  Other Topics  Concern  . Not on file  Social History Narrative  . Not on file   Social Determinants of Health   Financial Resource Strain: Not on file  Food Insecurity: Not on file  Transportation Needs: Not on file  Physical Activity: Not on file  Stress: Not on file  Social Connections: Not on file  Intimate Partner Violence: Not on file    Review of Systems: See HPI, otherwise negative ROS  Physical Exam: Vital signs in last 24 hours: Temp:  [97.5 F (36.4 C)] 97.5 F (36.4 C) (02/25 0653) Pulse Rate:  [57] 57 (02/25 0653) Resp:  [12] 12 (02/25 0653) BP: (158)/(93) 158/93 (02/25 0653) SpO2:  [97 %] 97 % (02/25 0653) Weight:  [103.4 kg] 103.4 kg (02/25 0643)   General:   Alert,  Well-developed, well-nourished, pleasant and cooperative in NAD Head:  Normocephalic and atraumatic. Eyes:  Sclera clear, no icterus.   Conjunctiva pink. Ears:  Normal auditory acuity. Nose:  No deformity, discharge,  or lesions. Mouth:  No deformity or lesions, dentition normal. Neck:  Supple; no masses or thyromegaly. Lungs:  Clear throughout to auscultation.   No wheezes, crackles, or rhonchi. No acute distress. Heart:  Regular rate and rhythm; no murmurs, clicks, rubs,  or gallops. Abdomen:  Soft, nontender and nondistended. No masses, hepatosplenomegaly or hernias  noted. Normal bowel sounds, without guarding, and without rebound.   Msk:  Symmetrical without gross deformities. Normal posture. Pulses:  Normal pulses noted. Extremities:  Without clubbing or edema. Neurologic:  Alert and  oriented x4;  grossly normal neurologically. Skin:  Intact without significant lesions or rashes. Cervical Nodes:  No significant cervical adenopathy. Psych:  Alert and cooperative. Normal mood and affect.  Impression/Plan: Leon Hawkins is here for a colonoscopy to be performed for colon cancer screening purposes.  The risks of the procedure including infection, bleed, or perforation as well as benefits, limitations,  alternatives and imponderables have been reviewed with the patient. Questions have been answered. All parties agreeable.

## 2020-06-13 NOTE — Anesthesia Postprocedure Evaluation (Signed)
Anesthesia Post Note  Patient: Leon Hawkins, Leon Hawkins  Procedure(s) Performed: COLONOSCOPY WITH PROPOFOL (N/A ) POLYPECTOMY  Patient location during evaluation: Phase II Anesthesia Type: General Level of consciousness: awake and alert and oriented Pain management: satisfactory to patient Vital Signs Assessment: post-procedure vital signs reviewed and stable Respiratory status: spontaneous breathing and respiratory function stable Cardiovascular status: blood pressure returned to baseline and stable Postop Assessment: no apparent nausea or vomiting Anesthetic complications: no   No complications documented.   Last Vitals:  Vitals:   06/13/20 0653  BP: (!) 158/93  Pulse: (!) 57  Resp: 12  Temp: (!) 36.4 C  SpO2: 97%    Last Pain:  Vitals:   06/13/20 0727  TempSrc:   PainSc: 0-No pain                 Karna Dupes

## 2020-06-13 NOTE — Transfer of Care (Signed)
Immediate Anesthesia Transfer of Care Note  Patient: Engineer, petroleum  Procedure(s) Performed: COLONOSCOPY WITH PROPOFOL (N/A ) POLYPECTOMY  Patient Location: PACU  Anesthesia Type:General  Level of Consciousness: awake, alert  and oriented  Airway & Oxygen Therapy: Patient Spontanous Breathing  Post-op Assessment: Report given to RN and Post -op Vital signs reviewed and stable  Post vital signs: Reviewed and stable  Last Vitals:  Vitals Value Taken Time  BP    Temp    Pulse    Resp    SpO2      Last Pain:  Vitals:   06/13/20 0727  TempSrc:   PainSc: 0-No pain      Patients Stated Pain Goal: 10 (22/48/25 0037)  Complications: No complications documented.

## 2020-06-13 NOTE — Discharge Instructions (Addendum)
Colonoscopy Discharge Instructions  Read the instructions outlined below and refer to this sheet in the next few weeks. These discharge instructions provide you with general information on caring for yourself after you leave the hospital. Your doctor may also give you specific instructions. While your treatment has been planned according to the most current medical practices available, unavoidable complications occasionally occur.   ACTIVITY  You may resume your regular activity, but move at a slower pace for the next 24 hours.   Take frequent rest periods for the next 24 hours.   Walking will help get rid of the air and reduce the bloated feeling in your belly (abdomen).   No driving for 24 hours (because of the medicine (anesthesia) used during the test).    Do not sign any important legal documents or operate any machinery for 24 hours (because of the anesthesia used during the test).  NUTRITION  Drink plenty of fluids.   You may resume your normal diet as instructed by your doctor.   Begin with a light meal and progress to your normal diet. Heavy or fried foods are harder to digest and may make you feel sick to your stomach (nauseated).   Avoid alcoholic beverages for 24 hours or as instructed.  MEDICATIONS  You may resume your normal medications unless your doctor tells you otherwise.  WHAT YOU CAN EXPECT TODAY  Some feelings of bloating in the abdomen.   Passage of more gas than usual.   Spotting of blood in your stool or on the toilet paper.  IF YOU HAD POLYPS REMOVED DURING THE COLONOSCOPY:  No aspirin products for 7 days or as instructed.   No alcohol for 7 days or as instructed.   Eat a soft diet for the next 24 hours.  FINDING OUT THE RESULTS OF YOUR TEST Not all test results are available during your visit. If your test results are not back during the visit, make an appointment with your caregiver to find out the results. Do not assume everything is normal if  you have not heard from your caregiver or the medical facility. It is important for you to follow up on all of your test results.  SEEK IMMEDIATE MEDICAL ATTENTION IF:  You have more than a spotting of blood in your stool.   Your belly is swollen (abdominal distention).   You are nauseated or vomiting.   You have a temperature over 101.   You have abdominal pain or discomfort that is severe or gets worse throughout the day.   Your colonoscopy revealed 1 large polyp(s) which I removed successfully. Await pathology results, my office will contact you. I recommend repeating colonoscopy in 3 years for surveillance purposes. You also have diverticulosis and internal hemorrhoids. I would recommend increasing fiber in your diet or adding OTC Benefiber/Metamucil. Be sure to drink at least 4 to 6 glasses of water daily. Follow-up with GI as needed.    I hope you have a great rest of your week!  Elon Alas. Abbey Chatters, D.O. Gastroenterology and Hepatology [facility]   Hemorrhoids Hemorrhoids are swollen veins that may develop:  In the butt (rectum). These are called internal hemorrhoids.  Around the opening of the butt (anus). These are called external hemorrhoids. Hemorrhoids can cause pain, itching, or bleeding. Most of the time, they do not cause serious problems. They usually get better with diet changes, lifestyle changes, and other home treatments. What are the causes? This condition may be caused by:  Having trouble pooping (constipation).  Pushing hard (straining) to poop.  Watery poop (diarrhea).  Pregnancy.  Being very overweight (obese).  Sitting for long periods of time.  Heavy lifting or other activity that causes you to strain.  Anal sex.  Riding a bike for a long period of time. What are the signs or symptoms? Symptoms of this condition include:  Pain.  Itching or soreness in the butt.  Bleeding from the butt.  Leaking  poop.  Swelling in the area.  One or more lumps around the opening of your butt. How is this diagnosed? A doctor can often diagnose this condition by looking at the affected area. The doctor may also:  Do an exam that involves feeling the area with a gloved hand (digital rectal exam).  Examine the area inside your butt using a small tube (anoscope).  Order blood tests. This may be done if you have lost a lot of blood.  Have you get a test that involves looking inside the colon using a flexible tube with a camera on the end (sigmoidoscopy or colonoscopy). How is this treated? This condition can usually be treated at home. Your doctor may tell you to change what you eat, make lifestyle changes, or try home treatments. If these do not help, procedures can be done to remove the hemorrhoids or make them smaller. These may involve:  Placing rubber bands at the base of the hemorrhoids to cut off their blood supply.  Injecting medicine into the hemorrhoids to shrink them.  Shining a type of light energy onto the hemorrhoids to cause them to fall off.  Doing surgery to remove the hemorrhoids or cut off their blood supply. Follow these instructions at home: Eating and drinking  Eat foods that have a lot of fiber in them. These include whole grains, beans, nuts, fruits, and vegetables.  Ask your doctor about taking products that have added fiber (fibersupplements).  Reduce the amount of fat in your diet. You can do this by: ? Eating low-fat dairy products. ? Eating less red meat. ? Avoiding processed foods.  Drink enough fluid to keep your pee (urine) pale yellow.   Managing pain and swelling  Take a warm-water bath (sitz bath) for 20 minutes to ease pain. Do this 3-4 times a day. You may do this in a bathtub or using a portable sitz bath that fits over the toilet.  If told, put ice on the painful area. It may be helpful to use ice between your warm baths. ? Put ice in a plastic  bag. ? Place a towel between your skin and the bag. ? Leave the ice on for 20 minutes, 2-3 times a day.   General instructions  Take over-the-counter and prescription medicines only as told by your doctor. ? Medicated creams and medicines may be used as told.  Exercise often. Ask your doctor how much and what kind of exercise is best for you.  Go to the bathroom when you have the urge to poop. Do not wait.  Avoid pushing too hard when you poop.  Keep your butt dry and clean. Use wet toilet paper or moist towelettes after pooping.  Do not sit on the toilet for a long time.  Keep all follow-up visits as told by your doctor. This is important. Contact a doctor if you:  Have pain and swelling that do not get better with treatment or medicine.  Have trouble pooping.  Cannot poop.  Have pain or swelling outside  the area of the hemorrhoids. Get help right away if you have:  Bleeding that will not stop. Summary  Hemorrhoids are swollen veins in the butt or around the opening of the butt.  They can cause pain, itching, or bleeding.  Eat foods that have a lot of fiber in them. These include whole grains, beans, nuts, fruits, and vegetables.  Take a warm-water bath (sitz bath) for 20 minutes to ease pain. Do this 3-4 times a day. This information is not intended to replace advice given to you by your health care provider. Make sure you discuss any questions you have with your health care provider. Document Revised: 04/13/2018 Document Reviewed: 08/25/2017 Elsevier Patient Education  2021 [employer].  Diverticulosis  Diverticulosis is a condition that develops when small pouches (diverticula) form in the wall of the large intestine (colon). The colon is where water is absorbed and stool (feces) is formed. The pouches form when the inside layer of the colon pushes through weak spots in the outer layers of the colon. You may have a few pouches or many of them. The pouches usually  do not cause problems unless they become inflamed or infected. When this happens, the condition is called diverticulitis. What are the causes? The cause of this condition is not known. What increases the risk? The following factors may make you more likely to develop this condition:  Being older than age 54. Your risk for this condition increases with age. Diverticulosis is rare among people younger than age 70. By age 42, many people have it.  Eating a low-fiber diet.  Having frequent constipation.  Being overweight.  Not getting enough exercise.  Smoking.  Taking over-the-counter pain medicines, like aspirin and ibuprofen.  Having a family history of diverticulosis. What are the signs or symptoms? In most people, there are no symptoms of this condition. If you do have symptoms, they may include:  Bloating.  Cramps in the abdomen.  Constipation or diarrhea.  Pain in the lower left side of the abdomen. How is this diagnosed? Because diverticulosis usually has no symptoms, it is most often diagnosed during an exam for other colon problems. The condition may be diagnosed by:  Using a flexible scope to examine the colon (colonoscopy).  Taking an X-ray of the colon after dye has been put into the colon (barium enema).  Having a CT scan. How is this treated? You may not need treatment for this condition. Your health care provider may recommend treatment to prevent problems. You may need treatment if you have symptoms or if you previously had diverticulitis. Treatment may include:  Eating a high-fiber diet.  Taking a fiber supplement.  Taking a live bacteria supplement (probiotic).  Taking medicine to relax your colon.   Follow these instructions at home: Medicines  Take over-the-counter and prescription medicines only as told by your health care provider.  If told by your health care provider, take a fiber supplement or probiotic. Constipation prevention Your  condition may cause constipation. To prevent or treat constipation, you may need to:  Drink enough fluid to keep your urine pale yellow.  Take over-the-counter or prescription medicines.  Eat foods that are high in fiber, such as beans, whole grains, and fresh fruits and vegetables.  Limit foods that are high in fat and processed sugars, such as fried or sweet foods.   General instructions  Try not to strain when you have a bowel movement.  Keep all follow-up visits as told by your health  care provider. This is important. Contact a health care provider if you:  Have pain in your abdomen.  Have bloating.  Have cramps.  Have not had a bowel movement in 3 days. Get help right away if:  Your pain gets worse.  Your bloating becomes very bad.  You have a fever or chills, and your symptoms suddenly get worse.  You vomit.  You have bowel movements that are bloody or black.  You have bleeding from your rectum. Summary  Diverticulosis is a condition that develops when small pouches (diverticula) form in the wall of the large intestine (colon).  You may have a few pouches or many of them.  This condition is most often diagnosed during an exam for other colon problems.  Treatment may include increasing the fiber in your diet, taking supplements, or taking medicines. This information is not intended to replace advice given to you by your health care provider. Make sure you discuss any questions you have with your health care provider. Document Revised: 11/02/2018 Document Reviewed: 11/02/2018 Elsevier Patient Education  [place].  Colon Polyps  Colon polyps are tissue growths inside the colon, which is part of the large intestine. They are one of the types of polyps that can grow in the body. A polyp may be a round bump or a mushroom-shaped growth. You could have one polyp or more than one. Most colon polyps are noncancerous (benign). However, some colon polyps can  become cancerous over time. Finding and removing the polyps early can help prevent this. What are the causes? The exact cause of colon polyps is not known. What increases the risk? The following factors may make you more likely to develop this condition:  Having a family history of colorectal cancer or colon polyps.  Being older than 71 years of age.  Being younger than 71 years of age and having a significant family history of colorectal cancer or colon polyps or a genetic condition that puts you at higher risk of getting colon polyps.  Having inflammatory bowel disease, such as ulcerative colitis or Crohn's disease.  Having certain conditions passed from parent to child (hereditary conditions), such as: ? Familial adenomatous polyposis (FAP). ? Lynch syndrome. ? Turcot syndrome. ? Peutz-Jeghers syndrome. ? MUTYH-associated polyposis (MAP).  Being overweight.  Certain lifestyle factors. These include smoking cigarettes, drinking too much alcohol, not getting enough exercise, and eating a diet that is high in fat and red meat and low in fiber.  Having had childhood cancer that was treated with radiation of the abdomen. What are the signs or symptoms? Many times, there are no symptoms. If you have symptoms, they may include:  Blood coming from the rectum during a bowel movement.  Blood in the stool (feces). The blood may be bright red or very dark in color.  Pain in the abdomen.  A change in bowel habits, such as constipation or diarrhea. How is this diagnosed? This condition is diagnosed with a colonoscopy. This is a procedure in which a lighted, flexible scope is inserted into the opening between the buttocks (anus) and then passed into the colon to examine the area. Polyps are sometimes found when a colonoscopy is done as part of routine cancer screening tests. How is this treated? This condition is treated by removing any polyps that are found. Most polyps can be removed  during a colonoscopy. Those polyps will then be tested for cancer. Additional treatment may be needed depending on the results of testing. Follow  these instructions at home: Eating and drinking  Eat foods that are high in fiber, such as fruits, vegetables, and whole grains.  Eat foods that are high in calcium and vitamin D, such as milk, cheese, yogurt, eggs, liver, fish, and broccoli.  Limit foods that are high in fat, such as fried foods and desserts.  Limit the amount of red meat, precooked or cured meat, or other processed meat that you eat, such as hot dogs, sausages, bacon, or meat loaves.  Limit sugary drinks.   Lifestyle  Maintain a healthy weight, or lose weight if recommended by your health care provider.  Exercise every day or as told by your health care provider.  Do not use any products that contain nicotine or tobacco, such as cigarettes, e-cigarettes, and chewing tobacco. If you need help quitting, ask your health care provider.  Do not drink alcohol if: ? Your health care provider tells you not to drink. ? You are pregnant, may be pregnant, or are planning to become pregnant.  If you drink alcohol: ? Limit how much you use to:  0-1 drink a day for women.  0-2 drinks a day for men. ? Know how much alcohol is in your drink. In the [place] one drink equals one 12 oz bottle of beer (355 mL), one 5 oz glass of wine (148 mL), or one 1 oz glass of hard liquor (44 mL). General instructions  Take over-the-counter and prescription medicines only as told by your health care provider.  Keep all follow-up visits. This is important. This includes having regularly scheduled colonoscopies. Talk to your health care provider about when you need a colonoscopy. Contact a health care provider if:  You have new or worsening bleeding during a bowel movement.  You have new or increased blood in your stool.  You have a change in bowel habits.  You lose weight for no known  reason. Summary  Colon polyps are tissue growths inside the colon, which is part of the large intestine. They are one type of polyp that can grow in the body.  Most colon polyps are noncancerous (benign), but some can become cancerous over time.  This condition is diagnosed with a colonoscopy.  This condition is treated by removing any polyps that are found. Most polyps can be removed during a colonoscopy. This information is not intended to replace advice given to you by your health care provider. Make sure you discuss any questions you have with your health care provider. Document Revised: 07/25/2019 Document Reviewed: 07/25/2019 Elsevier Patient Education  2021 [employer].

## 2020-06-13 NOTE — Op Note (Signed)
[facility] Patient Name: Leon Hawkins Procedure Date: 06/13/2020 7:06 AM MRN: [id number] Date of Birth: 20-Jun-1950 Attending MD: Elon Alas. Edgar Frisk CSN: [id number] Age: 70 Admit Type: Outpatient Procedure:                Colonoscopy Indications:              Screening for colorectal malignant neoplasm Providers:                Elon Alas. Abbey Chatters, DO, Lambert Mody, Dereck Leep, Technician Referring MD:              Medicines:                See the Anesthesia note for documentation of the                            administered medications Complications:            No immediate complications. Estimated Blood Loss:     Estimated blood loss was minimal. Procedure:                Pre-Anesthesia Assessment:                           - The anesthesia plan was to use monitored                            anesthesia care (MAC).                           After obtaining informed consent, the colonoscope                            was passed under direct vision. Throughout the                            procedure, the patient's blood pressure, pulse, and                            oxygen saturations were monitored continuously. The                            [id number]-[id number] ([id number]) scope was introduced through                            the anus and advanced to the the cecum, identified                            by appendiceal orifice and ileocecal valve. The                            colonoscopy was performed without difficulty. The                            patient tolerated the procedure well.  The quality                            of the bowel preparation was evaluated using the                            BBPS [facility] Bowel Preparation Scale) with scores                            of: Right Colon = 2 (minor amount of residual                            staining, small fragments of stool and/or opaque                            liquid, but  mucosa seen well), Transverse Colon = 3                            (entire mucosa seen well with no residual staining,                            small fragments of stool or opaque liquid) and Left                            Colon = 3 (entire mucosa seen well with no residual                            staining, small fragments of stool or opaque                            liquid). The total BBPS score equals 8. The quality                            of the bowel preparation was good. Scope In: 7:33:08 AM Scope Out: 7:53:57 AM Scope Withdrawal Time: 0 hours 16 minutes 54 seconds  Total Procedure Duration: 0 hours 20 minutes 49 seconds  Findings:      The perianal and digital rectal examinations were normal.      Non-bleeding internal hemorrhoids were found during endoscopy.      A few small-mouthed diverticula were found in the sigmoid colon.      A 12 mm polyp was found in the ascending colon. The polyp was sessile.       The polyp was removed with a hot snare. Resection and retrieval were       complete.      The exam was otherwise without abnormality. Impression:               - Non-bleeding internal hemorrhoids.                           - Diverticulosis in the sigmoid colon.                           - One 12 mm polyp in the ascending colon, removed  with a hot snare. Resected and retrieved.                           - The examination was otherwise normal. Moderate Sedation:      Per Anesthesia Care Recommendation:           - Patient has a contact number available for                            emergencies. The signs and symptoms of potential                            delayed complications were discussed with the                            patient. Return to normal activities tomorrow.                            Written discharge instructions were provided to the                            patient.                           - Resume previous diet.                            - Continue present medications.                           - Await pathology results.                           - Repeat colonoscopy in 3 years for surveillance.                           - Return to GI clinic PRN. Procedure Code(s):        --- Professional ---                           [phone], Colonoscopy, flexible; with removal of                            tumor(s), polyp(s), or other lesion(s) by snare                            technique Diagnosis Code(s):        --- Professional ---                           Z12.11, Encounter for screening for malignant                            neoplasm of colon                           K64.8, Other hemorrhoids  K63.5, Polyp of colon                           K57.30, Diverticulosis of large intestine without                            perforation or abscess without bleeding CPT copyright 2019 American Medical Association. All rights reserved. The codes documented in this report are preliminary and upon coder review may  be revised to meet current compliance requirements. Elon Alas. Abbey Chatters, DO [place] Abbey Chatters, DO 06/13/2020 8:01:02 AM This report has been signed electronically. Number of Addenda: 0

## 2020-06-16 LAB — SURGICAL PATHOLOGY

## 2020-06-18 ENCOUNTER — Encounter (HOSPITAL_COMMUNITY): Payer: Self-pay | Admitting: Internal Medicine

## 2020-06-20 NOTE — Progress Notes (Signed)
Dear Mr. Brege,   We have attempted to contact you on several occasions regarding your pathology report. Please call our office at your earliest convenience.     Thank you,  Floria Raveling, CMA

## 2020-07-21 ENCOUNTER — Ambulatory Visit: Payer: Medicare Other | Admitting: Urology

## 2020-07-21 DIAGNOSIS — R972 Elevated prostate specific antigen [PSA]: Secondary | ICD-10-CM

## 2020-09-01 ENCOUNTER — Encounter: Payer: Self-pay | Admitting: Urology

## 2020-09-01 ENCOUNTER — Other Ambulatory Visit: Payer: Self-pay

## 2020-09-01 ENCOUNTER — Ambulatory Visit (INDEPENDENT_AMBULATORY_CARE_PROVIDER_SITE_OTHER): Payer: Medicare Other | Admitting: Urology

## 2020-09-01 VITALS — BP 151/82 | HR 51 | Temp 97.7°F | Ht 70.0 in | Wt 228.0 lb

## 2020-09-01 DIAGNOSIS — R972 Elevated prostate specific antigen [PSA]: Secondary | ICD-10-CM

## 2020-09-01 LAB — URINALYSIS, ROUTINE W REFLEX MICROSCOPIC
Bilirubin, UA: NEGATIVE
Glucose, UA: NEGATIVE
Ketones, UA: NEGATIVE
Leukocytes,UA: NEGATIVE
Nitrite, UA: NEGATIVE
Protein,UA: NEGATIVE
RBC, UA: NEGATIVE
Specific Gravity, UA: >1.03 — ABNORMAL HIGH (ref 1.005–1.030)
Urobilinogen, Ur: 0.2 mg/dL (ref 0.2–1.0)
pH, UA: 5.5 (ref 5.0–7.5)

## 2020-09-01 NOTE — Progress Notes (Signed)
Urological Symptom Review  Patient is experiencing the following symptoms: Frequent urination Get up at night to urinate   Review of Systems  Gastrointestinal (upper)  : Negative for upper GI symptoms  Gastrointestinal (lower) : Negative for lower GI symptoms  Constitutional : Negative for symptoms  Skin: Itching  Eyes: Negative for eye symptoms  Ear/Nose/Throat : Negative for Ear/Nose/Throat symptoms  Hematologic/Lymphatic: Negative for Hematologic/Lymphatic symptoms  Cardiovascular : Negative for cardiovascular symptoms  Respiratory : Shortness of breath  Endocrine: Negative for endocrine symptoms  Musculoskeletal: Joint pain  Neurological: Negative for neurological symptoms  Psychologic: Negative for psychiatric symptoms

## 2020-09-01 NOTE — Progress Notes (Signed)
09/01/2020 1:54 PM   Leon Hawkins [birth year] [id number]  Referring provider: Neale Burly, MD [place],  [place] [id number]  CC: PSA elevation  HPI: New patient-  1) PSA elevation- his PSA 12/21 was 12.5. No prior PSA or bx. No FH of PCa. No h/o BPH.   He is on a baby aspirin.   PMH: Past Medical History:  Diagnosis Date  . Pneumothorax     Surgical History: Past Surgical History:  Procedure Laterality Date  . COLONOSCOPY WITH PROPOFOL N/A 06/13/2020   Procedure: COLONOSCOPY WITH PROPOFOL;  Surgeon: Eloise Harman, DO;  [facility]: [facility];  Service: Endoscopy;  Laterality: N/A;  ASA I/ 8:00  . LUNG SURGERY    . POLYPECTOMY  06/13/2020   Procedure: POLYPECTOMY;  Surgeon: Eloise Harman, DO;  Location: [facility];  Service: Endoscopy;;    Home Medications:  Allergies as of 09/01/2020      Reactions   Latex Rash   Elevates blood pressure      Medication List       Accurate as of Sep 01, 2020  1:54 PM. If you have any questions, ask your nurse or doctor.        albuterol 108 (90 Base) MCG/ACT inhaler Commonly known as: VENTOLIN HFA Inhale 1-2 puffs into the lungs every 6 (six) hours as needed for wheezing or shortness of breath.   aspirin EC 81 MG tablet Take 81 mg by mouth daily. Swallow whole.   vitamin B-12 500 MCG tablet Commonly known as: CYANOCOBALAMIN Take 500 mcg by mouth daily.   Vitamin B6 200 MG Tabs Take 200 mg by mouth daily. Takes 500 mg daily.       Allergies:  Allergies  Allergen Reactions  . Latex Rash    Elevates blood pressure    Family History: Family History  Problem Relation Age of Onset  . Cancer Mother   . Heart failure Father   . Hypertension Father   . Diabetes Sister     Social History:  reports that he has never smoked. He has never used smokeless tobacco. He reports that he does not drink alcohol and does not use drugs.   Physical Exam: There were no vitals taken for this visit.   Constitutional:  Alert and oriented, No acute distress. HEENT: [place] AT, moist mucus membranes.  Trachea midline, no masses. Cardiovascular: No clubbing, cyanosis, or edema. Respiratory: Normal respiratory effort, no increased work of breathing. GI: Abdomen is soft, nontender, nondistended, no abdominal masses GU: No CVA tenderness Lymph: No cervical or inguinal lymphadenopathy. Skin: No rashes, bruises or suspicious lesions. Neurologic: Grossly intact, no focal deficits, moving all 4 extremities. Psychiatric: Normal mood and affect. GU: prostate 25 g , no hard area or nodule but it's a small firm prostate in general   Laboratory Data: Lab Results  Component Value Date   WBC 7.1 03/31/2019   HGB 15.4 03/31/2019   HCT 47.7 03/31/2019   MCV 86.1 03/31/2019   PLT 270 03/31/2019    Lab Results  Component Value Date   CREATININE 0.90 03/31/2019    No results found for: PSA  No results found for: TESTOSTERONE  No results found for: HGBA1C  Urinalysis No results found for: COLORURINE, APPEARANCEUR, LABSPEC, PHURINE, GLUCOSEU, HGBUR, BILIRUBINUR, KETONESUR, PROTEINUR, UROBILINOGEN, NITRITE, LEUKOCYTESUR  No results found for: LABMICR, [place], RBCUA, LABEPIT, MUCUS, BACTERIA    Assessment & Plan:    PSA elevation- I had a long discussion  with the patient on the nature of elevated PSA - benign vs malignant causes. We discussed age specific levels and that PCa can be seen on a biopsy with very low PSA levels (<=2.5). We discussed the nature risks and benefits of continued surveillance, other lab tests, imaging as well as prostate biopsy. We discussed the management of prostate cancer might include active surveillance or treatment depending on biopsy findings. All questions answered. He would rather "leave it alone if he doesn't have an enlarged prostate". Again we discussed his PSAD might be high and the significance. Also a PSA over 10 can be more risk - chance of life threatening  prostate cancer is present. PSA was sent to determine f/u vs biopsy.     No follow-ups on file.  Festus Aloe, MD

## 2020-09-01 NOTE — Patient Instructions (Signed)
Prostate Cancer Screening  Prostate cancer screening is a test that is done to check for the presence of prostate cancer in men. The prostate gland is a walnut-sized gland that is located below the bladder and in front of the rectum in males. The function of the prostate is to add fluid to semen during ejaculation. Prostate cancer is the second most common type of cancer in men. Who should have prostate cancer screening?  Screening recommendations vary based on age and other risk factors. Screening is recommended if:  You are older than age 55. If you are age 55-69, talk with your health care provider about your need for screening and how often screening should be done. Because most prostate cancers are slow growing and will not cause death, screening is generally reserved in this age group for men who have a 10-15-year life expectancy.  You are younger than age 55, and you have these risk factors: ? Being a black male or a male of African descent. ? Having a father, brother, or uncle who has been diagnosed with prostate cancer. The risk is higher if your family member's cancer occurred at an early age. Screening is not recommended if:  You are younger than age 40.  You are between the ages of 40 and 54 and you have no risk factors.  You are 70 years of age or older. At this age, the risks that screening can cause are greater than the benefits that it may provide. If you are at high risk for prostate cancer, your health care provider may recommend that you have screenings more often or that you start screening at a younger age. How is screening for prostate cancer done? The recommended prostate cancer screening test is a blood test called the prostate-specific antigen (PSA) test. PSA is a protein that is made in the prostate. As you age, your prostate naturally produces more PSA. Abnormally high PSA levels may be caused by:  Prostate cancer.  An enlarged prostate that is not caused by cancer  (benign prostatic hyperplasia, BPH). This condition is very common in older men.  A prostate gland infection (prostatitis). Depending on the PSA results, you may need more tests, such as:  A physical exam to check the size of your prostate gland.  Blood and imaging tests.  A procedure to remove tissue samples from your prostate gland for testing (biopsy). What are the benefits of prostate cancer screening?  Screening can help to identify cancer at an early stage, before symptoms start and when the cancer can be treated more easily.  There is a small chance that screening may lower your risk of dying from prostate cancer. The chance is small because prostate cancer is a slow-growing cancer, and most men with prostate cancer die from a different cause. What are the risks of prostate cancer screening? The main risk of prostate cancer screening is diagnosing and treating prostate cancer that would never have caused any symptoms or problems. This is called overdiagnosisand overtreatment. PSA screening cannot tell you if your PSA is high due to cancer or a different cause. A prostate biopsy is the only procedure to diagnose prostate cancer. Even the results of a biopsy may not tell you if your cancer needs to be treated. Slow-growing prostate cancer may not need any treatment other than monitoring, so diagnosing and treating it may cause unnecessary stress or other side effects. A prostate biopsy may also cause:  Infection or fever.  A false negative. This is   a result that shows that you do not have prostate cancer when you actually do have prostate cancer. Questions to ask your health care provider  When should I start prostate cancer screening?  What is my risk for prostate cancer?  How often do I need screening?  What type of screening tests do I need?  How do I get my test results?  What do my results mean?  Do I need treatment? Where to find more information  The American Cancer  Society: www.cancer.org  American Urological Association: www.auanet.org Contact a health care provider if:  You have difficulty urinating.  You have pain when you urinate or ejaculate.  You have blood in your urine or semen.  You have pain in your back or in the area of your prostate. Summary  Prostate cancer is a common type of cancer in men. The prostate gland is located below the bladder and in front of the rectum. This gland adds fluid to semen during ejaculation.  Prostate cancer screening may identify cancer at an early stage, when the cancer can be treated more easily.  The prostate-specific antigen (PSA) test is the recommended screening test for prostate cancer.  Discuss the risks and benefits of prostate cancer screening with your health care provider. If you are age 42 or older, the risks that screening can cause are greater than the benefits that it may provide. This information is not intended to replace advice given to you by your health care provider. Make sure you discuss any questions you have with your health care provider. Document Revised: 07/27/2019 Document Reviewed: 11/16/2018 Elsevier Patient Education  [place].

## 2020-09-02 LAB — PSA: Prostate Specific Ag, Serum: 12.4 ng/mL — ABNORMAL HIGH (ref 0.0–4.0)

## 2020-09-04 ENCOUNTER — Telehealth: Payer: Self-pay

## 2020-09-04 ENCOUNTER — Other Ambulatory Visit: Payer: Self-pay

## 2020-09-04 DIAGNOSIS — R972 Elevated prostate specific antigen [PSA]: Secondary | ICD-10-CM

## 2020-09-04 MED ORDER — LEVOFLOXACIN 750 MG PO TABS: 750.0000 mg | ORAL_TABLET | Freq: Once | ORAL | 0 refills | Status: DC

## 2020-09-04 MED ORDER — LEVOFLOXACIN 750 MG PO TABS: 750.0000 mg | ORAL_TABLET | Freq: Once | ORAL | 0 refills | Status: AC

## 2020-09-04 NOTE — Progress Notes (Signed)
Patient returned call to office today wishing to proceed with prostate biopsy- Appt for June 6th offered- patient unable to do. Next available date given to patient for June 20th. Instructions mailed to patient as per patient requested.

## 2020-09-04 NOTE — Telephone Encounter (Signed)
I called patient to discuss results and schedule biopsy- patient does not wish to schedule prostate biopsy at this time. Patient will call back if he decide.

## 2020-09-04 NOTE — Telephone Encounter (Signed)
-----   Message from Festus Aloe, MD sent at 09/03/2020  8:24 AM EDT ----- Pastos know his PSA remains very elevated. I would recommend a prostate biopsy as discussed. Go ahead and get him scheduled and send in abx. Schedule an appt for results, too. Thanks!   ----- Message ----- From: Dorisann Frames, RN Sent: 09/02/2020   3:03 PM EDT To: Festus Aloe, MD  Please review

## 2020-10-06 ENCOUNTER — Ambulatory Visit (HOSPITAL_COMMUNITY)
Admission: RE | Admit: 2020-10-06 | Discharge: 2020-10-06 | Disposition: A | Discharge status: Home / Self Care | Payer: Medicare Other | Source: Ambulatory Visit | Attending: Urology | Admitting: Urology

## 2020-10-06 ENCOUNTER — Ambulatory Visit (INDEPENDENT_AMBULATORY_CARE_PROVIDER_SITE_OTHER): Payer: Medicare Other | Admitting: Urology

## 2020-10-06 ENCOUNTER — Encounter (HOSPITAL_COMMUNITY): Payer: Self-pay

## 2020-10-06 ENCOUNTER — Other Ambulatory Visit: Payer: Self-pay

## 2020-10-06 ENCOUNTER — Other Ambulatory Visit: Payer: Self-pay | Admitting: Urology

## 2020-10-06 ENCOUNTER — Encounter: Payer: Self-pay | Admitting: Urology

## 2020-10-06 DIAGNOSIS — R972 Elevated prostate specific antigen [PSA]: Secondary | ICD-10-CM

## 2020-10-06 DIAGNOSIS — N402 Nodular prostate without lower urinary tract symptoms: Secondary | ICD-10-CM | POA: Diagnosis present

## 2020-10-06 MED ORDER — CEFTRIAXONE SODIUM 1 G IJ SOLR: 1.0000 g | Freq: Once | INTRAMUSCULAR | Status: AC

## 2020-10-06 MED ORDER — LIDOCAINE HCL (PF) 1 % IJ SOLN
INTRAMUSCULAR | Status: AC
  Administered 2020-10-06: 12:00:00 2.1 mL via INTRADERMAL
  Filled 2020-10-06: qty 5

## 2020-10-06 MED ORDER — LIDOCAINE HCL (PF) 2 % IJ SOLN
INTRAMUSCULAR | Status: AC
  Administered 2020-10-06: 12:00:00 10 mL
  Filled 2020-10-06: qty 10

## 2020-10-06 MED ORDER — GENTAMICIN SULFATE 40 MG/ML IJ SOLN: 160.0000 mg | Freq: Once | INTRAMUSCULAR | Status: DC

## 2020-10-06 MED ORDER — CEFTRIAXONE SODIUM 1 G IJ SOLR
INTRAMUSCULAR | Status: AC
  Administered 2020-10-06: 12:00:00 1 g via INTRAMUSCULAR
  Filled 2020-10-06: qty 10

## 2020-10-06 MED ORDER — GENTAMICIN SULFATE 40 MG/ML IJ SOLN
INTRAMUSCULAR | Status: AC
  Administered 2020-10-06: 12:00:00 80 mg via INTRAMUSCULAR
  Filled 2020-10-06: qty 4

## 2020-10-06 MED ORDER — GENTAMICIN SULFATE 40 MG/ML IJ SOLN: 80.0000 mg | Freq: Once | INTRAMUSCULAR | Status: AC

## 2020-10-06 MED ORDER — LIDOCAINE HCL (PF) 2 % IJ SOLN: 10.0000 mL | Freq: Once | INTRAMUSCULAR | Status: AC

## 2020-10-06 MED ORDER — LIDOCAINE HCL (PF) 1 % IJ SOLN: 2.1000 mL | Freq: Once | INTRAMUSCULAR | Status: AC

## 2020-10-06 NOTE — Progress Notes (Signed)
Prostate Biopsy Procedure   F/u -    1) PSA elevation- his PSA 12/21 was 12.5. No prior PSA or bx. No FH of PCa. No h/o BPH. Repeat PSA 05/22 elevated at 12.4.    He is on a baby aspirin.    Informed consent was obtained after discussing risks/benefits of the procedure.  A time out was performed to ensure correct patient identity.  Pre-Procedure: - Last PSA Level: No results found for: PSA - Pt did not take his po abx -Gentamicin 80 mg and ceftriaxone 1 gram IM given prophylactically -DRE - prostate firm areas bilaterally  -Transrectal Ultrasound performed revealing a 44.71 gm prostate -scattered hypoechoic areas of the PZ bilaterally. Small median lobe noted.  Procedure: - Prostate block performed using 10 cc 1% lidocaine and biopsies taken from sextant areas, a total of 12 under ultrasound guidance.  Post-Procedure: - Patient tolerated the procedure well - He was counseled to seek immediate medical attention if experiences any severe pain, significant bleeding, or fevers - Return in one week to discuss biopsy results

## 2020-10-06 NOTE — Patient Instructions (Signed)
Transrectal Ultrasound-Guided Prostate Biopsy, Care After This sheet gives you information about how to care for yourself after your procedure. Your doctor may also give you more specific instructions. If youhave problems or questions, contact your doctor. What can I expect after the procedure? After the procedure, it is common to have: Pain and discomfort in your butt, especially while sitting. Pink-colored pee (urine), due to small amounts of blood in the pee. Burning while peeing (urinating). Blood in your poop (stool). Bleeding from your butt. Blood in your semen. Follow these instructions at home: Medicines Take over-the-counter and prescription medicines only as told by your doctor. If you were prescribed antibiotic medicine, take it as told by your doctor. Do not stop taking the antibiotic even if you start to feel better. Activity  Do not drive for 24 hours if you were given a medicine to help you relax (sedative) during your procedure. Return to your normal activities as told by your doctor. Ask your doctor what activities are safe for you. Ask your doctor when it is okay for you to have sex. Do not lift anything that is heavier than 10 lb (4.5 kg), or the limit that you are told, until your doctor says that it is safe.  General instructions  Drink enough water to keep your pee pale yellow. Watch your pee, poop, and semen for new bleeding or bleeding that gets worse. Keep all follow-up visits as told by your doctor. This is important.  Contact a doctor if you: Have blood clots in your pee or poop. Notice that your pee smells bad or unusual. Have very bad belly pain. Have trouble peeing. Notice that your lower belly feels firm. Have blood in your pee for more than 2 weeks after the procedure. Have blood in your semen for more than 2 months after the procedure. Have problems getting an erection. Feel sick to your stomach (nauseous) or throw up (vomit). Have new or worse  bleeding in your pee, poop, or semen. Get help right away if you: Have a fever or chills. Have bright red pee. Have very bad pain that does not get better with medicine. Cannot pee. Summary After this procedure, it is common to have pain and discomfort around your butt, especially while sitting. You may have blood in your pee and poop. It is common to have blood in your semen for 1-2 months. If you were prescribed antibiotic medicine, take it as told by your doctor. Do not stop taking the antibiotic even if you start to feel better. Get help right away if you have a fever or chills. This information is not intended to replace advice given to you by your health care provider. Make sure you discuss any questions you have with your healthcare provider. Document Revised: 02/18/2020 Document Reviewed: 12/20/2019 Elsevier Patient Education  2022 Elsevier Inc.  

## 2020-10-06 NOTE — Sedation Documentation (Signed)
PT tolerated prostate biopsy procedure and IM antibiotic injections well today. Labs obtained and sent for pathology. PT ambulatory at discharge with no acute distress noted and verbalized understanding of discharge instructions.  

## 2020-10-30 ENCOUNTER — Encounter: Payer: Self-pay | Admitting: Internal Medicine

## 2020-11-03 ENCOUNTER — Ambulatory Visit (INDEPENDENT_AMBULATORY_CARE_PROVIDER_SITE_OTHER): Payer: Medicare Other | Admitting: Urology

## 2020-11-03 ENCOUNTER — Encounter: Payer: Self-pay | Admitting: Urology

## 2020-11-03 ENCOUNTER — Other Ambulatory Visit: Payer: Self-pay

## 2020-11-03 VITALS — BP 138/77 | HR 66

## 2020-11-03 DIAGNOSIS — C61 Malignant neoplasm of prostate: Secondary | ICD-10-CM | POA: Diagnosis not present

## 2020-11-03 DIAGNOSIS — R972 Elevated prostate specific antigen [PSA]: Secondary | ICD-10-CM

## 2020-11-03 NOTE — Patient Instructions (Signed)
Prostate Cancer  The prostate is a small gland (1.5 inches [3.8 cm] wide and 1 inch [2.5 cm] high) that is involved in the production of semen. It is located below a man's bladder, in front of the rectum. Prostate cancer is the abnormal growth ofcells in the prostate gland. What are the causes? The exact cause of this condition is not known. What increases the risk? You are more likely to develop this condition if: You are 71 years of age or older. You are African American. You have a family history of prostate cancer. You have a family history of breast cancer. What are the signs or symptoms? Symptoms of this condition include: A need to urinate often. Weak or interrupted flow of urine. Trouble starting or stopping urination. Inability to urinate. Blood in urine or semen. Persistent pain or discomfort in the lower back, lower abdomen, hips, or upper thighs. Trouble getting an erection. Trouble emptying the bladder all the way. How is this diagnosed? This condition can be diagnosed with: A digital rectal exam. For this exam, a health care provider inserts a gloved finger into the rectum to feel the prostate gland. A blood test called a prostate-specific antigen (PSA) test. A procedure in which a sample of tissue is taken from the prostate and checked under a microscope (prostate biopsy). An imaging test called transrectal ultrasonography. Once the condition is diagnosed, tests will be done to determine how far the cancer has spread. This is called staging the cancer. Staging may involve imaging tests, such as: A bone scan. A CT scan. A PET scan. An MRI. The stages of prostate cancer are as follows: Stage I. At this stage, the cancer is found in the prostate only. The cancer is not visible on imaging tests, and it is usually found by accident, such as during prostate surgery. Stage II. At this stage, the cancer is more advanced than it is in stage I, but the cancer has not spread  outside the prostate. Stage III. At this stage, the cancer has spread beyond the outer layer of the prostate to nearby tissues. The cancer may be found in the seminal vesicles, which are near the bladder and the prostate. Stage IV. At this stage, the cancer has spread to other parts of the body, such as the lymph nodes, bones, bladder, rectum, liver, or lungs. How is this treated? Treatment for this condition depends on several factors, including the stage of the cancer, your age, personal preferences, and your overall health. Talk with your health care provider about treatment options that are recommended for you. Common treatments include: Observation for early stage prostate cancer (active surveillance). This involves having exams, blood tests, and in some cases, more biopsies. For some men, this is the only treatment needed. Surgery. Types of surgeries include: Open surgery (prostatectomy). In this surgery, a larger incision is made to remove the prostate. A laparoscopic prostatectomy. This is a surgery to remove the prostate and lymph nodes through several, small incisions. It is often referred to as a minimally invasive surgery. A robotic prostatectomy. This is laparoscopic surgery to remove the prostate and lymph nodes with the help of robotic arms that are controlled by the surgeon. Orchiectomy. This is surgery to remove the testicles. Cryosurgery. This is surgery to freeze and destroy cancer cells. Radiation treatment. Types of radiation treatment include: External beam radiation. This type aims beams of radiation from outside the body at the prostate to destroy cancerous cells. Brachytherapy. This type uses radioactive needles,   seeds, wires, or tubes that are implanted into the prostate gland. Like external beam radiation, brachytherapy destroys cancerous cells. An advantage is that this type of radiation limits the damage to surrounding tissue and has fewer side effects. High-intensity,  focused ultrasonography. This treatment destroys cancer cells by delivering high-energy ultrasound waves to the cancerous cells. Chemotherapy medicines. This treatment kills cancer cells or stops them from multiplying. It kills both cancer cells and normal cells. Targeted therapy. This treatment uses medicines to kill cancer cells without damaging normal cells. Hormone treatment. This treatment involves taking medicines that act on one of the male hormones (testosterone): By stopping your body from producing testosterone. By blocking testosterone from reaching cancer cells. Follow these instructions at home: Take over-the-counter and prescription medicines only as told by your health care provider. Maintain a healthy diet. Get plenty of sleep. Consider joining a support group for men who have prostate cancer. Meeting with a support group may help you learn to manage the stress of having cancer. If you have to go to the hospital, notify your cancer specialist (oncologist). Treatment for prostate cancer may affect sexual function. Continue to have intimate moments with your partner. This may include touching, holding, hugging, and caressing. Keep all follow-up visits as told by your health care provider. This is important. Contact a health care provider if: You have new or increasing trouble urinating. You have new or increasing blood in your urine. You have new or increasing pain in your hips, back, or chest. Get help right away if: You have weakness or numbness in your legs. You cannot control urination or your bowel movements (incontinence). You have chills or a fever. Summary The prostate is a small gland that is involved in the production of semen. It is located below a man's bladder, in front of the rectum. Prostate cancer is the abnormal growth of cells in the prostate gland. Treatment for this condition depends on the stage of the cancer, your age, personal preferences, and your  overall health. Talk with your health care provider about treatment options that are recommended for you. Consider joining a support group for men who have prostate cancer. Meeting with a support group may help you learn to cope with the stress of having cancer. This information is not intended to replace advice given to you by your health care provider. Make sure you discuss any questions you have with your healthcare provider. Document Revised: 03/20/2019 Document Reviewed: 03/20/2019 Elsevier Patient Education  2022 [employer]  

## 2020-11-03 NOTE — Progress Notes (Signed)
11/03/2020 3:22 PM   Leon Hawkins [birth year] [id number]  Referring provider: Neale Burly, MD [address] [place],  [place] [address]  CC: prostate cancer   HPI:  F/u -   1) Prostate cancer - pt with intermediate risk Prostate cancer diagnosed June 2022.   June 2022 Unfavorable Intermediate Risk PCa PSA 12.5 T2c (bilateral firm) Prostate 45 grams Gleason 4+3=7, four cores Gleason 3+4=7, four cores Gleason 3+3=6, one core 9/12 cores 10-80% MSK: risk of SV invasion and LAD > 20%   He is doing well. He typically voids with a good flow. No frequency or urgency. Noc x 0-1. Still some hematospermia.   He takes vitamin B6 and B12. He hauls sheds and levels them.    PMH: Past Medical History:  Diagnosis Date   Acid reflux    Asthma    Pneumothorax     Surgical History: Past Surgical History:  Procedure Laterality Date   COLONOSCOPY WITH PROPOFOL N/A 06/13/2020   Procedure: COLONOSCOPY WITH PROPOFOL;  Surgeon: Eloise Harman, DO;  Location: [facility];  Service: Endoscopy;  Laterality: N/A;  ASA I/ 8:00   LUNG SURGERY     POLYPECTOMY  06/13/2020   Procedure: POLYPECTOMY;  Surgeon: Eloise Harman, DO;  Location: [facility];  Service: Endoscopy;;    Home Medications:  Allergies as of 11/03/2020       Reactions   Latex Rash   Elevates blood pressure        Medication List        Accurate as of November 03, 2020  3:22 PM. If you have any questions, ask your nurse or doctor.          albuterol 108 (90 Base) MCG/ACT inhaler Commonly known as: VENTOLIN HFA Inhale 1-2 puffs into the lungs every 6 (six) hours as needed for wheezing or shortness of breath.   aspirin EC 81 MG tablet Take 81 mg by mouth daily. Swallow whole.   vitamin B-12 500 MCG tablet Commonly known as: CYANOCOBALAMIN Take 500 mcg by mouth daily.   Vitamin B6 200 MG Tabs Take 200 mg by mouth daily. Takes 500 mg daily.        Allergies:  Allergies  Allergen Reactions    Latex Rash    Elevates blood pressure    Family History: Family History  Problem Relation Age of Onset   Cancer Mother    Heart failure Father    Hypertension Father    Diabetes Sister     Social History:  reports that he has never smoked. He has never used smokeless tobacco. He reports that he does not drink alcohol and does not use drugs.   Physical Exam: There were no vitals taken for this visit.  Constitutional:  Alert and oriented, No acute distress. HEENT: [place] AT, moist mucus membranes.  Trachea midline, no masses. Cardiovascular: No clubbing, cyanosis, or edema. Respiratory: Normal respiratory effort, no increased work of breathing. Skin: No rashes, bruises or suspicious lesions. Neurologic: Grossly intact, no focal deficits, moving all 4 extremities. Psychiatric: Normal mood and affect.  Laboratory Data: Lab Results  Component Value Date   WBC 7.1 03/31/2019   HGB 15.4 03/31/2019   HCT 47.7 03/31/2019   MCV 86.1 03/31/2019   PLT 270 03/31/2019    Lab Results  Component Value Date   CREATININE 0.90 03/31/2019    No results found for: PSA  No results found for: TESTOSTERONE  No results found for: HGBA1C  Urinalysis    Component Value Date/Time   APPEARANCEUR Clear 09/01/2020 1440   GLUCOSEU Negative 09/01/2020 1440   BILIRUBINUR Negative 09/01/2020 1440   PROTEINUR Negative 09/01/2020 1440   NITRITE Negative 09/01/2020 1440   LEUKOCYTESUR Negative 09/01/2020 1440    Lab Results  Component Value Date   LABMICR Comment 09/01/2020    Pertinent Imaging: N/a No results found for this or any previous visit.  No results found for this or any previous visit.  No results found for this or any previous visit.  No results found for this or any previous visit.  No results found for this or any previous visit.  No results found for this or any previous visit.  No results found for this or any previous visit.  No results found for this or any  previous visit.   Assessment & Plan:    PCa -  I had a long discussion with the patient using the Prostate Cancer guide and his bx report. We went his stage, grade and prognosis and the relevant anatomy. We discussed the nature risks and benefits of active surveillance, radical prostatectomy, IMRT (+/- brachytherapy, +/- ADT). We discussed specifically how each treatment might affect the bowel, bladder and sexual function. We discussed how each treatment might effect salvage treatments. We discussed the role of other modalities in the treatment of prostate cancer including chemotherapy, HIFU and cryotherapy. I recommended treatment for this unfavorable PCa.   Pt said no surgery. He had surgery on his lung and wants no further "cutting". He would like to do the "radioactive BBs". We discussed why primary brachytherapy might not be the best option (palpable disease, primarily Gleason 4, PSA > 10 etc.). He is a little hesitant about external beam because "they really dont know how much they are giving or where it is going". This comes from an associate he knows that had radiation for throat cancer and had complication with swallowing and his teeth. I told him to keep an open mind about ebxrt and ways we target prostate and avoid rectum (fiducials and spaceoar).   No follow-ups on file.  Festus Aloe, MD  [facility]  [address] [place], [place] [address] [phone]

## 2020-11-04 ENCOUNTER — Telehealth: Payer: Self-pay | Admitting: Radiation Oncology

## 2020-11-24 ENCOUNTER — Ambulatory Visit (HOSPITAL_COMMUNITY)
Admission: RE | Admit: 2020-11-24 | Discharge: 2020-11-24 | Disposition: A | Discharge status: Home / Self Care | Payer: Medicare Other | Source: Ambulatory Visit | Attending: Urology | Admitting: Urology

## 2020-11-24 ENCOUNTER — Other Ambulatory Visit: Payer: Self-pay

## 2020-11-24 DIAGNOSIS — C61 Malignant neoplasm of prostate: Secondary | ICD-10-CM | POA: Diagnosis present

## 2020-11-24 MED ORDER — PIFLIFOLASTAT F 18 (PYLARIFY) INJECTION
9.0000 | Freq: Once | INTRAVENOUS | Status: AC
  Administered 2020-11-24: 8.21 via INTRAVENOUS

## 2020-11-27 NOTE — Progress Notes (Signed)
GU Location of Tumor / Histology:  Adenocarcinoma of the prostate  If Prostate Cancer, Gleason Score is (4 + 3), PSA (12.5 as of 03/2020), and Prostate volume (45 g)  Leon Hawkins presented with signs/symptoms of: elevated PSA  PET Scan 11/24/2020 --IMPRESSION: 1. Multifocal tracer accumulation within the prostate, likely the site of primary or primaries. 2. No tracer avid soft tissue or osseous metastasis. 3. Tiny nonspecific pulmonary nodules, unlikely to represent metastatic disease. This could be re-evaluated with chest CT at 6 months. 4. Incidental findings, including: Cholelithiasis. Aortic Atherosclerosis  Biopsies revealed:  10/06/2020   Past/Anticipated interventions by urology, if any:  10/06/2020 Dr. Festus Aloe Transrectal ultrasound of prostate with biopsies  Past/Anticipated interventions by medical oncology, if any:  No referral placed at this time  Weight changes, if any: no  IPSS Score: 4 SHIM Score:18  Bowel/Bladder complaints, if any: no   Nausea/Vomiting, if any: no  Pain issues, if any:  yes in back from previous pneumothorax  SAFETY ISSUES: Prior radiation? no Pacemaker/ICD? no Possible current pregnancy? N/A Is the patient on methotrexate? no  Current Complaints / other details:  Pain from scar tissue from pneumothorax.

## 2020-12-01 ENCOUNTER — Telehealth: Payer: Self-pay

## 2020-12-01 ENCOUNTER — Ambulatory Visit
Admission: RE | Admit: 2020-12-01 | Discharge: 2020-12-01 | Disposition: A | Discharge status: Home / Self Care | Payer: Medicare Other | Source: Ambulatory Visit | Attending: Radiation Oncology | Admitting: Radiation Oncology

## 2020-12-01 ENCOUNTER — Other Ambulatory Visit: Payer: Self-pay

## 2020-12-01 ENCOUNTER — Encounter: Payer: Self-pay | Admitting: Radiation Oncology

## 2020-12-01 VITALS — BP 145/93 | HR 60 | Temp 96.9°F | Resp 18 | Ht 70.0 in | Wt 240.1 lb

## 2020-12-01 DIAGNOSIS — C61 Malignant neoplasm of prostate: Secondary | ICD-10-CM | POA: Diagnosis present

## 2020-12-01 DIAGNOSIS — I700 Atherosclerosis of aorta: Secondary | ICD-10-CM | POA: Diagnosis not present

## 2020-12-01 DIAGNOSIS — R918 Other nonspecific abnormal finding of lung field: Secondary | ICD-10-CM | POA: Insufficient documentation

## 2020-12-01 DIAGNOSIS — K649 Unspecified hemorrhoids: Secondary | ICD-10-CM | POA: Diagnosis not present

## 2020-12-01 DIAGNOSIS — K219 Gastro-esophageal reflux disease without esophagitis: Secondary | ICD-10-CM | POA: Insufficient documentation

## 2020-12-01 DIAGNOSIS — K8020 Calculus of gallbladder without cholecystitis without obstruction: Secondary | ICD-10-CM | POA: Diagnosis not present

## 2020-12-01 DIAGNOSIS — Z809 Family history of malignant neoplasm, unspecified: Secondary | ICD-10-CM | POA: Insufficient documentation

## 2020-12-01 HISTORY — DX: Malignant neoplasm of prostate: C61

## 2020-12-01 NOTE — Telephone Encounter (Signed)
Called to check on patient. He was up in the waiting area waiting to be told where to go

## 2020-12-01 NOTE — Progress Notes (Signed)
Radiation Oncology         [phone] ________________________________  Initial Outpatient Consultation  Name: Leon Hawkins MRN: [id number]  Date: 12/01/2020  DOB: 01/08/1951  [id number], Samul Dada, MD  Festus Aloe, MD   REFERRING PHYSICIAN: Festus Aloe, MD  DIAGNOSIS: 70 y.o. 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    ICD-10-CM   1. Calculus of gallbladder without cholecystitis without obstruction  K80.20     2. Malignant neoplasm of prostate ([place])  C61     3. Aortic atherosclerosis (HCC)  I70.0     4. Pulmonary nodules  R91.8       HISTORY OF PRESENT ILLNESS: Leon Hawkins is a 70 y.o. male with a diagnosis of prostate cancer. He was noted to have an elevated PSA of 12.5 by his primary care physician, Dr. Sherrie Sport in December 2021.  Accordingly, he was referred for evaluation in urology by Dr. Junious Silk on 09/01/20,  digital rectal examination was performed at that time revealing a firm abnormal gland bilaterally.  The patient proceeded to transrectal ultrasound with 12 biopsies of the prostate on 10/06/20.  The prostate volume measured 47.7 cc.  Out of 12 core biopsies, 9 were positive.  The maximum Gleason score was 4+3, and this was seen in the left apex, right base, right mid and right apex with 3+4 seen in the right lateral mid, right lateral apex, left mid and left lateral apex with 3+3 seen in the left lateral mid.    He underwent PSMA PET for staging purposes which showed multifocal tracer accumulation within the prostate, likely the site of primary or primaries.      There was no tracer avid soft tissue or osseous metastasis.  There were tiny nonspecific pulmonary nodules, unlikely to represent metastatic disease. This could be re-evaluated with chest CT at 6 months.  Other incidental findings, including: Cholelithiasis. Aortic Atherosclerosis   The patient reviewed the biopsy results with his urologist  and he has kindly been referred today for discussion of potential radiation treatment options.   PREVIOUS RADIATION THERAPY: No  PAST MEDICAL HISTORY:  Past Medical History:  Diagnosis Date   Acid reflux    Asthma    Pneumothorax    Prostate CA ([place])       PAST SURGICAL HISTORY: Past Surgical History:  Procedure Laterality Date   COLONOSCOPY WITH PROPOFOL N/A 06/13/2020   Procedure: COLONOSCOPY WITH PROPOFOL;  Surgeon: Eloise Harman, DO;  [facility];  Service: Endoscopy;  Laterality: N/A;  ASA I/ 8:00   LUNG SURGERY     POLYPECTOMY  06/13/2020   Procedure: POLYPECTOMY;  Surgeon: Eloise Harman, DO;  Location: [facility];  Service: Endoscopy;;   PROSTATE BIOPSY      FAMILY HISTORY:  Family History  Problem Relation Age of Onset   Cancer Mother    Heart failure Father    Hypertension Father    Diabetes Sister     SOCIAL HISTORY:  Social History   Socioeconomic History   Marital status: Legally Separated    Spouse name: Not on file   Number of children: Not on file   Years of education: Not on file   Highest education level: Not on file  Occupational History   Occupation: self employed  Tobacco Use   Smoking status: Never   Smokeless tobacco: Never  Vaping Use   Vaping Use: Never used  Substance and Sexual Activity  Alcohol use: Never   Drug use: Never   Sexual activity: Not on file  Other Topics Concern   Not on file  Social History Narrative   Not on file   Social Determinants of Health   Financial Resource Strain: Not on file  Food Insecurity: Not on file  Transportation Needs: Not on file  Physical Activity: Not on file  Stress: Not on file  Social Connections: Not on file  Intimate Partner Violence: Not on file    ALLERGIES: Latex  MEDICATIONS:  Current Outpatient Medications  Medication Sig Dispense Refill   aspirin EC 81 MG tablet Take 81 mg by mouth daily. Swallow whole.     Pyridoxine HCl (VITAMIN B6) 200 MG  TABS Take 200 mg by mouth daily. Takes 500 mg daily.     vitamin B-12 (CYANOCOBALAMIN) 500 MCG tablet Take 500 mcg by mouth daily.     albuterol (VENTOLIN HFA) 108 (90 Base) MCG/ACT inhaler Inhale 1-2 puffs into the lungs every 6 (six) hours as needed for wheezing or shortness of breath. (Patient not taking: Reported on 12/01/2020)     No current facility-administered medications for this encounter.    REVIEW OF SYSTEMS:  On review of systems, the patient reports that he is doing well overall. He denies any chest pain, shortness of breath, cough, fevers, chills, night sweats, unintended weight changes. He denies any bowel disturbances, and denies abdominal pain, nausea or vomiting. He denies any new musculoskeletal or joint aches or pains. His IPSS was 4, indicating mild urinary symptoms. His SHIM was 18, indicating he does have mild/moderate erectile dysfunction. A complete review of systems is obtained and is otherwise negative.    PHYSICAL EXAM:  Wt Readings from Last 3 Encounters:  12/01/20 240 lb 2 oz (108.9 kg)  09/01/20 228 lb (103.4 kg)  06/13/20 228 lb (103.4 kg)   Temp Readings from Last 3 Encounters:  12/01/20 (!) 96.9 F (36.1 C) (Temporal)  10/06/20 97.9 F (36.6 C) (Oral)  09/01/20 97.7 F (36.5 C)   BP Readings from Last 3 Encounters:  12/01/20 (!) 145/93  11/03/20 138/77  10/06/20 127/76   Pulse Readings from Last 3 Encounters:  12/01/20 60  11/03/20 66  10/06/20 (!) 58   Pain Assessment Pain Score: 4  Pain Frequency: Constant Pain Loc: Back/10  In general this is a well appearing man in no acute distress. He's alert and oriented x4 and appropriate throughout the examination. Cardiopulmonary assessment is negative for acute distress, and he exhibits normal effort.     KPS = 100  100 - Normal; no complaints; no evidence of disease. 90   - Able to carry on normal activity; minor signs or symptoms of disease. 80   - Normal activity with effort; some signs or  symptoms of disease. 45   - Cares for self; unable to carry on normal activity or to do active work. 60   - Requires occasional assistance, but is able to care for most of his personal needs. 50   - Requires considerable assistance and frequent medical care. 54   - Disabled; requires special care and assistance. 36   - Severely disabled; hospital admission is indicated although death not imminent. 110   - Very sick; hospital admission necessary; active supportive treatment necessary. 10   - Moribund; fatal processes progressing rapidly. 0     - Dead  Karnofsky DA, Abelmann WH, Craver LS and Burchenal [facility] [phone]) The use of the nitrogen mustards in the  palliative treatment of carcinoma: with particular reference to bronchogenic carcinoma Cancer 1 634-56  LABORATORY DATA:  Lab Results  Component Value Date   WBC 7.1 03/31/2019   HGB 15.4 03/31/2019   HCT 47.7 03/31/2019   MCV 86.1 03/31/2019   PLT 270 03/31/2019   Lab Results  Component Value Date   NA 135 03/31/2019   K 4.1 03/31/2019   CL 103 03/31/2019   CO2 23 03/31/2019   No results found for: ALT, AST, GGT, ALKPHOS, BILITOT   RADIOGRAPHY: NM PET (PSMA) SKULL TO MID THIGH  Result Date: 11/25/2020 CLINICAL DATA:  New diagnosis of prostate cancer with PSA of 12.4. EXAM: NUCLEAR MEDICINE PET SKULL BASE TO THIGH TECHNIQUE: 8.2 mCi F18 Piflufolastat (Pylarify) was injected intravenously. Full-ring PET imaging was performed from the skull base to thigh after the radiotracer. CT data was obtained and used for attenuation correction and anatomic localization. COMPARISON:  None. FINDINGS: NECK No radiotracer activity in neck lymph nodes. Incidental CT finding: No cervical adenopathy. CHEST No tracer accumulation with thoracic nodes or lungs. Incidental CT finding: Aortic atherosclerosis. Mild cardiomegaly. 3 mm nodule along the right minor fissure is most likely a subpleural lymph node. 3 mm right lower lobe subpleural pulmonary nodule on 53/8  may also represent a subpleural lymph node. Left upper lobe pulmonary nodules of 3 mm on 31/8 and 2 mm on 34/8 are nonspecific. ABDOMEN/PELVIS Prostate: Multifocal prostatic tracer accumulation, most significant in the left posterior prostate (likely the peripheral zone) mid to apical gland at a S.U.V. max of 6.7. Lymph nodes: No abnormal radiotracer accumulation within pelvic or abdominal nodes. Liver: No evidence of liver metastasis Incidental CT finding: Multiple stones within a contracted gallbladder. Normal adrenal glands. Abdominal aortic atherosclerosis with nonaneurysmal dilatation of up to 3.2 cm on 164/4. Recommend follow-up ultrasound every 3 years. This recommendation follows ACR consensus guidelines: White Paper of the ACR Incidental Findings Committee II on Vascular Findings. J Am Coll Radiol 2013; 10:789-794. Retroaortic left renal vein.  No abdominopelvic adenopathy. Fat containing left inguinal hernia. SKELETON No focal activity to suggest skeletal metastasis. Left sacral 1.2 cm sclerotic lesion is not tracer avid and is favored to represent a bone island. Note is made of a intramuscular lipoma within the right trapezius on 83/4. IMPRESSION: 1. Multifocal tracer accumulation within the prostate, likely the site of primary or primaries. 2. No tracer avid soft tissue or osseous metastasis. 3. Tiny nonspecific pulmonary nodules, unlikely to represent metastatic disease. This could be re-evaluated with chest CT at 6 months. 4. Incidental findings, including: Cholelithiasis. Aortic Atherosclerosis (ICD10-I70.0). Electronically Signed   By: Abigail Miyamoto M.D.   On: 11/25/2020 10:53      IMPRESSION/PLAN: 1. 70 y.o. gentleman with Stage T2c adenocarcinoma of the prostate with Gleason Score of 4+3, and PSA of 12.5. We discussed the patient's workup and outlined the nature of prostate cancer in this setting. The patient's T stage, Gleason's score, and PSA put him into the Unfavorable Intermediate Risk  group. Accordingly, he is eligible for a variety of potential treatment options including brachytherapy, 5.5-8 weeks of external radiation, or prostatectomy. We discussed the available radiation techniques, and focused on the details and logistics of delivery.  We discussed and outlined the risks, benefits, short and long-term effects associated with radiotherapy and compared and contrasted these with prostatectomy. We discussed the role of SpaceOAR gel in reducing the rectal toxicity associated with radiotherapy.  He appears to have a good understanding of his disease and our  treatment recommendations which are of curative intent.  He was encouraged to ask questions that were answered to his stated satisfaction.  We spent a great deal of time talking about external radiation versus prostate brachytherapy using low-dose rate seed implant.  We reviewed how the NCCN guidelines would recommend external radiation treatment for patients with Gleason 4+3 unfavorable intermediate risk disease, particularly with palpable nodules increase in the risk for extracapsular extension.  We also talked about data from our own institution and others with a high level of experience using prostate brachytherapy demonstrating that monotherapy with LDR brachytherapy seeds can be equally effective to external radiation and even external radiation with seed implant boost.  However, this is not the standard of care nationally as the experience level at varying institutions varies widely.  Patient does have several reasonable concerns about how external radiation is delivered.  He understands that external radiation would be the more standard approach, based on guidelines.  At the conclusion of our conversation, the patient is interested in moving forward with prostate seed implant.  Patient does have hemorrhoids and has an appointment with gastroenterology for a possible banding procedure.  I do not think the hemorrhoids will  interfere with the pathway of our prostate seed needles or the ultrasound probe, but the existence of active hemorrhoids may lead to a higher risk for some postoperative rectal bleeding following seed implant.  Patient understands this and would prefer to have this hemorrhoids treated prior to brachytherapy if that does not lead to a significant delay.  With regard to the incidental findings on his PET scan, including There were tiny nonspecific pulmonary nodules, unlikely to represent metastatic disease. This could be re-evaluated with chest CT at 6 months.  Other incidental findings, including: Cholelithiasis. Aortic Atherosclerosis.  While these are important findings that will require attention as they relate to his future care, they will not impact recommendations for the management of his prostate cancer.  We personally spent 60 minutes in this encounter including chart review, reviewing radiological studies, meeting face-to-face with the patient, entering orders and completing documentation.      Tyler Pita, MD  [facility]  Radiation Oncology Direct Dial: [phone]  Fax: [phone] [email or web address]  Skype  LinkedIn

## 2020-12-02 NOTE — Progress Notes (Signed)
Sent via mychart

## 2020-12-05 ENCOUNTER — Telehealth: Payer: Self-pay | Admitting: *Deleted

## 2020-12-05 NOTE — Telephone Encounter (Signed)
CALLED PATIENT TO UPDATE, LVM FOR A RETURN CALL 

## 2020-12-12 ENCOUNTER — Telehealth: Payer: Self-pay | Admitting: *Deleted

## 2020-12-12 NOTE — Telephone Encounter (Signed)
CALLED PATIENT TO ASK IF HE PREFERS TO HAVE HEMORRHOID SURGERY OR IMPLANT FIRST, LVM FOR A RETURN CALL

## 2020-12-23 ENCOUNTER — Telehealth: Payer: Self-pay | Admitting: Gastroenterology

## 2020-12-23 ENCOUNTER — Telehealth: Payer: Self-pay | Admitting: *Deleted

## 2020-12-23 ENCOUNTER — Encounter: Payer: Self-pay | Admitting: Gastroenterology

## 2020-12-23 NOTE — Telephone Encounter (Signed)
Erline Levine and Reba:   Patient needs expedited appointment for hemorrhoid banding. He has prostate cancer and needs seed implant, with bleeding risks increased due to presence of hemorrhoids. I have spoken to both Dr. Sherrie Sport (PCP) and Dr. Tammi Klippel (Radiation Oncology).   I have asked the patient to come in tomorrow at 10am. Please open 10am slot ONLY for banding. Patient is aware. Thank you!

## 2020-12-23 NOTE — Telephone Encounter (Signed)
CALLED PATIENT, UNABLE TO LEAVE MESSAGE DUE TO VM BEING FULL

## 2020-12-24 ENCOUNTER — Telehealth: Payer: Self-pay | Admitting: Internal Medicine

## 2020-12-24 ENCOUNTER — Encounter: Payer: Self-pay | Admitting: Gastroenterology

## 2020-12-24 ENCOUNTER — Other Ambulatory Visit: Payer: Self-pay

## 2020-12-24 ENCOUNTER — Ambulatory Visit (INDEPENDENT_AMBULATORY_CARE_PROVIDER_SITE_OTHER): Payer: Medicare Other | Admitting: Gastroenterology

## 2020-12-24 VITALS — BP 159/97 | HR 58 | Temp 97.7°F | Ht 70.0 in | Wt 238.6 lb

## 2020-12-24 DIAGNOSIS — K642 Third degree hemorrhoids: Secondary | ICD-10-CM | POA: Diagnosis not present

## 2020-12-24 NOTE — Progress Notes (Signed)
  [facility] Banding Note:   70 year old male with history of symptomatic hemorrhoids, with last colonoscopy Feb 2022, presenting for banding evaluation. He was recently found to have prostate cancer diagnosed in June 2022 and plans for prostate seed implant. Hemorrhoid banding has been expedited due to higher risk of post-op bleeding.   The patient presents with symptomatic grade 3 hemorrhoids, unresponsive to maximal medical therapy, requesting rubber band ligation of his hemorrhoidal disease. All risks, benefits, and alternative forms of therapy were described and informed consent was obtained.   The decision was made to band the left lateral internal hemorrhoid, and the [place]ana was used to perform band ligation without complication. Latex-free band was used. Digital anorectal examination was then performed to assure proper positioning of the band, and to adjust the banded tissue as required. The patient was discharged home without pain or other issues. Dietary and behavioral recommendations were given and (if necessary prescriptions were given), along with follow-up instructions. The patient will return Sept 22 at 11am for followup and additional banding. Due to need to expedite this process, I will consider placing 2 bands at next visit if tolerated. Discussed with patient only one band placed today to ensure he does well.   No complications were encountered and the patient tolerated the procedure well.  Annitta Needs, PhD, ANP-BC [facility]enter[facility]

## 2020-12-24 NOTE — Telephone Encounter (Signed)
Please make an opening for 11am on 09/22 for a banding with Vicente Males

## 2020-12-24 NOTE — Patient Instructions (Signed)
We will see you back September 22nd at 11am for additional banding.  Please call with any questions in the meantime!  Avoid straining, limit toilet time to 2-3 minutes, and avoid constipation.   It was a pleasure to see you today. I want to create trusting relationships with patients to provide genuine, compassionate, and quality care. I value your feedback. If you receive a survey regarding your visit,  I greatly appreciate you taking time to fill this out.   Annitta Needs, PhD, ANP-BC [facility] Gastroenterology

## 2020-12-25 ENCOUNTER — Telehealth: Payer: Self-pay | Admitting: *Deleted

## 2020-12-25 NOTE — Telephone Encounter (Signed)
CALLED PATIENT TO UPDATE, LVM FOR A RETURN CALL 

## 2020-12-26 ENCOUNTER — Telehealth: Payer: Self-pay | Admitting: *Deleted

## 2020-12-26 ENCOUNTER — Encounter: Payer: Self-pay | Admitting: Internal Medicine

## 2020-12-26 NOTE — Telephone Encounter (Signed)
RETURNED PATIENT'S PHONE CALL, SPOKE WITH PATIENT. ?

## 2020-12-31 ENCOUNTER — Telehealth: Payer: Self-pay | Admitting: *Deleted

## 2020-12-31 ENCOUNTER — Other Ambulatory Visit: Payer: Self-pay | Admitting: Urology

## 2020-12-31 DIAGNOSIS — C61 Malignant neoplasm of prostate: Secondary | ICD-10-CM

## 2020-12-31 NOTE — Telephone Encounter (Signed)
Called patient to inform of pre-seed appts. and implant date, lvm for a return call

## 2021-01-08 ENCOUNTER — Ambulatory Visit (INDEPENDENT_AMBULATORY_CARE_PROVIDER_SITE_OTHER): Payer: Medicare Other | Admitting: Gastroenterology

## 2021-01-08 ENCOUNTER — Other Ambulatory Visit: Payer: Self-pay

## 2021-01-08 ENCOUNTER — Encounter: Payer: Self-pay | Admitting: Gastroenterology

## 2021-01-08 VITALS — BP 132/79 | HR 60 | Temp 97.3°F | Ht 70.0 in | Wt 230.6 lb

## 2021-01-08 DIAGNOSIS — K642 Third degree hemorrhoids: Secondary | ICD-10-CM

## 2021-01-08 NOTE — Patient Instructions (Signed)
We will see you next Thursday at 11 am!  Continue to avoid straining and constipation.  I enjoyed seeing you again today! As you know, I value our relationship and want to provide genuine, compassionate, and quality care. I welcome your feedback. If you receive a survey regarding your visit,  I greatly appreciate you taking time to fill this out. See you next time!  Annitta Needs, PhD, ANP-BC [facility]

## 2021-01-08 NOTE — Progress Notes (Signed)
[place] Banding Note:   Leon Hawkins is a 70 y.o. male presenting today for hemorrhoid banding.  Recent colonoscopy on file. He was recently found to have prostate cancer diagnosed in June 2022 and plans for prostate seed implant. Hemorrhoid banding has been expedited due to higher risk of post-op bleeding. Left lateral banding has been completed. Some improvement since last visit. Still symptomatic.    The patient presents with symptomatic grade 3 hemorrhoids, unresponsive to maximal medical therapy, requesting rubber band ligation of his hemorrhoidal disease. All risks, benefits, and alternative forms of therapy were described and informed consent was obtained.  The decision was made to band the right posterior internal hemorrhoid, and the [place] was used to perform band ligation without complication. Digital anorectal examination was then performed to assure proper positioning of the band, and to adjust the banded tissue as required. The patient was discharged home without pain or other issues. Dietary and behavioral recommendations were given and (if necessary prescriptions were given), along with follow-up instructions. The patient will return next Thursday at 11am for followup and possible additional banding as required.  No complications were encountered and the patient tolerated the procedure well.   Annitta Needs, PhD, ANP-BC [facility]enter[facility]

## 2021-01-15 ENCOUNTER — Other Ambulatory Visit: Payer: Self-pay

## 2021-01-15 ENCOUNTER — Encounter: Payer: Self-pay | Admitting: Gastroenterology

## 2021-01-15 ENCOUNTER — Ambulatory Visit (INDEPENDENT_AMBULATORY_CARE_PROVIDER_SITE_OTHER): Payer: Medicare Other | Admitting: Gastroenterology

## 2021-01-15 VITALS — BP 143/83 | HR 56 | Temp 97.3°F | Ht 70.0 in | Wt 238.0 lb

## 2021-01-15 DIAGNOSIS — K642 Third degree hemorrhoids: Secondary | ICD-10-CM | POA: Diagnosis not present

## 2021-01-15 NOTE — Patient Instructions (Signed)
We can see you back on November 2nd at 10 am if still need banding; you can cancel this appointment if doing well!  I enjoyed seeing you again today! As you know, I value our relationship and want to provide genuine, compassionate, and quality care. I welcome your feedback. If you receive a survey regarding your visit,  I greatly appreciate you taking time to fill this out. See you next time!  Annitta Needs, PhD, ANP-BC [facility]astroenter[facility]

## 2021-01-15 NOTE — Progress Notes (Signed)
[place] Banding Note:   Leon Hawkins is a 70 y.o. male presenting today for consideration of hemorrhoid banding. Recent colonoscopy on file.  He was recently found to have prostate cancer diagnosed in June 2022 and plans for prostate seed implant. Hemorrhoid banding has been expedited due to higher risk of post-op bleeding. Left lateral and right posterior have been banded. He has noted improvement but still with prolapse, bleeding, irritation.    The patient presents with symptomatic grade 3 hemorrhoids, unresponsive to maximal medical therapy, requesting rubber band ligation of his hemorrhoidal disease. All risks, benefits, and alternative forms of therapy were described and informed consent was obtained.  The decision was made to band the right anterior internal hemorrhoid. However, he had notable discomfort with this. We then elected to focus attention back to the left lateral internal column, as he had noticed the majority of prolapsing sensation from this area. The [place] was used to perform band ligation without complication. Digital anorectal examination was then performed to assure proper positioning of the band, and to adjust the banded tissue as required. The patient was discharged home without pain or other issues. Dietary and behavioral recommendations were given and (if necessary prescriptions were given), along with follow-up instructions. The patient will return in several weeks for followup and possible additional banding as required.  No complications were encountered and the patient tolerated the procedure well.   Annitta Needs, PhD, ANP-BC [facility] G[facility]enter[facility]

## 2021-01-21 ENCOUNTER — Telehealth: Payer: Self-pay | Admitting: *Deleted

## 2021-01-21 NOTE — Telephone Encounter (Signed)
CALLED PATIENT TO REMIND OF PRE-SEED APPTS. FOR 01-22-21, SPOKE WITH PATIENT AND HE IS AWARE OF THESE APPTS.

## 2021-01-22 ENCOUNTER — Other Ambulatory Visit: Payer: Self-pay

## 2021-01-22 ENCOUNTER — Ambulatory Visit
Admission: RE | Admit: 2021-01-22 | Discharge: 2021-01-22 | Disposition: A | Discharge status: Home / Self Care | Payer: Medicare Other | Source: Ambulatory Visit | Attending: Radiation Oncology | Admitting: Radiation Oncology

## 2021-01-22 ENCOUNTER — Encounter (HOSPITAL_COMMUNITY)
Admission: RE | Admit: 2021-01-22 | Discharge: 2021-01-22 | Disposition: A | Discharge status: Home / Self Care | Payer: Medicare Other | Source: Ambulatory Visit | Attending: Urology | Admitting: Urology

## 2021-01-22 ENCOUNTER — Encounter: Payer: Self-pay | Admitting: Urology

## 2021-01-22 ENCOUNTER — Ambulatory Visit
Admission: RE | Admit: 2021-01-22 | Discharge: 2021-01-22 | Disposition: A | Discharge status: Home / Self Care | Payer: Medicare Other | Source: Ambulatory Visit | Attending: Urology | Admitting: Urology

## 2021-01-22 ENCOUNTER — Ambulatory Visit (HOSPITAL_COMMUNITY)
Admission: RE | Admit: 2021-01-22 | Discharge: 2021-01-22 | Disposition: A | Discharge status: Home / Self Care | Payer: Medicare Other | Source: Ambulatory Visit | Attending: Urology | Admitting: Urology

## 2021-01-22 VITALS — BP 137/74 | HR 64 | Temp 97.0°F | Resp 18 | Wt 238.0 lb

## 2021-01-22 DIAGNOSIS — C61 Malignant neoplasm of prostate: Secondary | ICD-10-CM

## 2021-01-22 DIAGNOSIS — I447 Left bundle-branch block, unspecified: Secondary | ICD-10-CM

## 2021-01-22 HISTORY — DX: Left bundle-branch block, unspecified: I44.7

## 2021-01-22 NOTE — Progress Notes (Signed)
Spoke with patient and pt states no cardiologist and no cardiac symptoms, spoke with connie at [facility] urology and made aware pt needs cardiac clearance due to new left bundle branch block finding on ekg

## 2021-01-22 NOTE — Progress Notes (Signed)
  Radiation Oncology         [phone] ________________________________  Name: Edman Lipsey MRN: [id number]  Date: 01/22/2021  DOB: 02-Apr-1951  SIMULATION AND TREATMENT PLANNING NOTE PUBIC ARCH STUDY  [id number], Samul Dada, MD  Festus Aloe, MD  DIAGNOSIS:  70 y.o. 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  Oncology History  Malignant neoplasm of prostate ([place])  10/06/2020 Cancer Staging   Staging form: Prostate, AJCC 8th Edition - Clinical stage from 10/06/2020: Stage IIC (cT2c, cN0, cM0, PSA: 12.5, Grade Group: 3) - Signed by Freeman Caldron, PA-C on 01/22/2021 Histopathologic type: Adenocarcinoma, NOS Stage prefix: Initial diagnosis Prostate specific antigen (PSA) range: 10 to 19 Gleason primary pattern: 4 Gleason secondary pattern: 3 Gleason score: 7 Histologic grading system: 5 grade system Number of biopsy cores examined: 12 Number of biopsy cores positive: 9 Location of positive needle core biopsies: Both sides   12/01/2020 Initial Diagnosis   Malignant neoplasm of prostate ([place])       ICD-10-CM   1. Malignant neoplasm of prostate ([place])  C61       COMPLEX SIMULATION:  The patient presented today for evaluation for possible prostate seed implant. He was brought to the radiation planning suite and placed supine on the CT couch. A 3-dimensional image study set was obtained in upload to the planning computer. There, on each axial slice, I contoured the prostate gland. Then, using three-dimensional radiation planning tools I reconstructed the prostate in view of the structures from the transperineal needle pathway to assess for possible pubic arch interference. In doing so, I did not appreciate any pubic arch interference. Also, the patient's prostate volume was estimated based on the drawn structure. The volume was 47 cc.  Given the pubic arch appearance and prostate volume, patient remains a good candidate to proceed  with prostate seed implant. Today, he freely provided informed written consent to proceed.    PLAN: The patient will undergo prostate seed implant.   ________________________________  Sheral Apley. Tammi Klippel, M.D.

## 2021-01-22 NOTE — Progress Notes (Signed)
Patient reports occasional, mild diarrhea/ constipation. No other symptoms reported at this time.  No current urinary management medications and no urology follow-up scheduled at this time-per patient.  Meaningful use complete.  BP 137/74   Pulse 64   Temp (!) 97 F (36.1 C)   Resp 18   Wt 238 lb (108 kg)   SpO2 99%   BMI 34.15 kg/m

## 2021-01-23 NOTE — Progress Notes (Signed)
Received  call from Dr Junious Silk office requested EKG done yesterday 01-22-2021 to be faxed and they can send to cardiology.  EKG has been confirmed , printed it and will faxe to Dr Kyung Bacca.

## 2021-01-26 ENCOUNTER — Encounter: Payer: Self-pay | Admitting: *Deleted

## 2021-01-26 NOTE — Progress Notes (Signed)
[place] Psychosocial Distress Screening Clinical Social Work  Clinical Social Work was referred by distress screening protocol.  The patient scored a 6 on the Psychosocial Distress Thermometer which indicates moderate distress. Clinical Social Worker contacted patient by phone to assess for distress and other psychosocial needs.  CSW left patient a voicemail offering support and encouraging patient to return call.    ONCBCN DISTRESS SCREENING 01/22/2021  Screening Type   Distress experienced in past week (1-10) 6  Practical problem type   Family Problem type   Emotional problem type Adjusting to illness  Information Concerns Type   Physical Problem type    Leon Hawkins, MSW, LCSW, OSW-C Clinical Social Worker [place] [phone]

## 2021-01-26 NOTE — Progress Notes (Signed)
[place] Psychosocial Distress Screening Clinical Social Work  Clinical Social Work was referred by distress screening protocol.  The patient scored a 6 on the Psychosocial Distress Thermometer which indicates moderate distress. Clinical Social Worker contacted patient by phone to assess for distress and other psychosocial needs.   Patient stated he was doing well and had no concerns at this time.  CSW provided education on CSW role and the support team at [facility].  Patient was agreeable to being added to the monthly program mailing list.  CSW provide contact information and encouraged patient to call with questions or concerns.     ONCBCN DISTRESS SCREENING 01/22/2021  Screening Type   Distress experienced in past week (1-10) 6  Practical problem type   Family Problem type   Emotional problem type Adjusting to illness  Information Concerns Type   Physical Problem type    Johnnye Lana, MSW, LCSW, OSW-C Clinical Social Worker [place] [phone]

## 2021-02-03 ENCOUNTER — Ambulatory Visit (INDEPENDENT_AMBULATORY_CARE_PROVIDER_SITE_OTHER): Payer: Medicare Other | Admitting: Gastroenterology

## 2021-02-03 ENCOUNTER — Encounter: Payer: Self-pay | Admitting: Gastroenterology

## 2021-02-03 ENCOUNTER — Other Ambulatory Visit: Payer: Self-pay

## 2021-02-03 VITALS — BP 148/79 | HR 60 | Temp 96.6°F | Ht 70.0 in | Wt 237.8 lb

## 2021-02-03 DIAGNOSIS — K642 Third degree hemorrhoids: Secondary | ICD-10-CM

## 2021-02-03 NOTE — Patient Instructions (Signed)
We will see you back as needed!  Please message if you feel you need additional banding!  I enjoyed seeing you again today! As you know, I value our relationship and want to provide genuine, compassionate, and quality care. I welcome your feedback. If you receive a survey regarding your visit,  I greatly appreciate you taking time to fill this out. See you next time!  Annitta Needs, PhD, ANP-BC [facility]

## 2021-02-03 NOTE — Progress Notes (Signed)
       [place] Banding Note:    Leon Hawkins is a 70 y.o. male presenting today for consideration of hemorrhoid banding. Recent colonoscopy on file.  He was recently found to have prostate cancer diagnosed in June 2022 and plans for prostate seed implant. Hemorrhoid banding has been expedited due to higher risk of post-op bleeding. Left lateral banded X 2, right posterior banded. We had difficulty with right anterior due to discomfort, so we have avoided this column at last visit.    The patient presents with symptomatic grade 3 hemorrhoids, unresponsive to maximal medical therapy, requesting rubber band ligation of his/her hemorrhoidal disease. All risks, benefits, and alternative forms of therapy were described and informed consent was obtained.  The decision was made to band neutrally, and the [place] was used to perform band ligation without complication. Latex-free bands used. Digital anorectal examination was then performed to assure proper positioning of the band, and to adjust the banded tissue as required. The patient was discharged home without pain or other issues. Dietary and behavioral recommendations were given and (if necessary prescriptions were given), along with follow-up instructions. The patient will return in followup as needed for  additional banding as required.  No complications were encountered and the patient tolerated the procedure well.   Annitta Needs, PhD, ANP-BC [facility] '

## 2021-02-17 ENCOUNTER — Telehealth: Payer: Self-pay

## 2021-02-17 DIAGNOSIS — C61 Malignant neoplasm of prostate: Secondary | ICD-10-CM

## 2021-02-17 HISTORY — DX: Malignant neoplasm of prostate: C61

## 2021-02-17 NOTE — Telephone Encounter (Signed)
NOTES SCANNED TO REFERRAL 

## 2021-02-19 DIAGNOSIS — Z01810 Encounter for preprocedural cardiovascular examination: Secondary | ICD-10-CM | POA: Insufficient documentation

## 2021-02-19 NOTE — Assessment & Plan Note (Addendum)
The patient has an asymptomatic left bundle branch block.  He is undergoing urologic surgery.  This is a relatively low risk procedure from a cardiovascular standpoint.  He is able to perform 4 METS of activity without any issues.  No stress testing is required prior to noncardiac surgery.  I will obtain an echocardiogram to evaluate his murmur further.  Typically only severe valvular lesions such as severe critical aortic stenosis would need to be intervened upon prior to noncardiac surgery.  Certainly if his ejection fraction is abnormally low we would then pursue coronary imaging or cardiac catheterization for further restratification.

## 2021-02-19 NOTE — Progress Notes (Signed)
Cardiology Office Note:    Date:  02/20/2021   ID:  Leon Hawkins, DOB 1950-04-29, MRN [id number]  PCP:  Leon Burly, MD   Leon Hawkins Cardiologist:  Leon Osmond, MD     Referring MD: Leon Aloe, MD   Chief Complaint:  Preoperative cardiac risk stratification for noncardiac surgery  History of Present Illness:    PROBLEM LIST: 1.  Prostate cancer 2.  Left bundle branch block  [name] is a 70 y.o. male with the indicated medical problems here for recommendations regarding preoperative cardiac assessment for noncardiac surgery.  He had an EKG which demonstrated a new left bundle branch block.  He also had a CXR which remarked on cardiomegaly.  In terms of his symptoms the patient still works full-time doing rather strenuous physical labor.  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  He will move this shed out to where it needs to be and oftentimes he needs to carry 40 pound boxes in both hands.  He does this without any exertional angina.  He denies any exertional shortness of breath.  He does have a history of a pneumothorax requiring thoracotomy back 20 years ago.  This was the result of some medical exposure.  He sometimes notices that if he lays in a certain way he will be short of breath.  This has been a chronic issue.  He is also been told he has had a murmur for some time.  He is required no emergency room visits or hospitalizations for any reason.  He denies any presyncope, syncope, palpitations, paroxysmal, dyspnea, or orthopnea.  He does have some positional dyspnea due to scar tissue from his thoracotomy surgery.    Previous Medical/Surgical History:    Past Medical History:  Diagnosis Date   Acid reflux    Asthma    Pneumothorax    Prostate CA Leon Hawkins)     Past Surgical History:  Procedure Laterality Date   COLONOSCOPY WITH PROPOFOL N/A 06/13/2020   Procedure: COLONOSCOPY WITH PROPOFOL;  Surgeon: Leon Harman, DO;   Location[facility];  Service: Endoscopy;  Laterality: N/A;  ASA I/ 8:00   LUNG SURGERY     POLYPECTOMY  06/13/2020   Procedure: POLYPECTOMY;  Surgeon: Leon Harman, DO;  Location: [facility];  Service: Endoscopy;;   PROSTATE BIOPSY      Current Medications: Current Meds  Medication Sig   albuterol (VENTOLIN HFA) 108 (90 Base) MCG/ACT inhaler Inhale 1-2 puffs into the lungs every 6 (six) hours as needed for wheezing or shortness of breath.   aspirin EC 81 MG tablet Take 81 mg by mouth daily. Swallow whole.   Multiple Vitamin (MULTIVITAMIN) tablet Take 1 tablet by mouth daily.   Pyridoxine HCl (VITAMIN B6) 200 MG TABS Take 200 mg by mouth daily. Takes 500 mg daily.   vitamin B-12 (CYANOCOBALAMIN) 500 MCG tablet Take 500 mcg by mouth daily.     Allergies:   Latex   Social History:    Social History   Socioeconomic History   Marital status: Single    Spouse name: Not on file   Number of children: Not on file   Years of education: Not on file   Highest education level: Not on file  Occupational History   Occupation: self employed  Tobacco Use   Smoking status: Never   Smokeless tobacco: Never  Vaping Use   Vaping Use: Never used  Substance and Sexual Activity   Alcohol use:  Never   Drug use: Never   Sexual activity: Not on file  Other Topics Concern   Not on file  Social History Narrative   Not on file   Social Determinants of Health   Financial Resource Strain: Not on file  Food Insecurity: Not on file  Transportation Needs: Not on file  Physical Activity: Not on file  Stress: Not on file  Social Connections: Not on file     Family History:  The patient's family history includes Cancer in his mother; Diabetes in his sister; Heart failure in his father; Hypertension in his father.  ROS:  Please see the history of present illness.  All other systems reviewed and are negative.  EKGs/Labs/Other Studies Reviewed:    The following studies were reviewed  today: Chest x-ray demonstrates stable cardiomegaly and EKGs are detailed as above.  EKG:   The ekg ordered today demonstrates sinus rhythm and a left bundle branch block.  Recent Labs: No results found for requested labs within last 8760 hours.   Recent Lipid Panel No results found for: CHOL, TRIG, HDL, CHOLHDL, VLDL, LDLCALC, LDLDIRECT   Risk Assessment/Calculations:           Physical Exam:    VS:  BP (!) 142/70   Pulse 62   Ht 5\' 10"  (1.778 m)   Wt 237 lb (107.5 kg)   SpO2 97%   BMI 34.01 kg/m     Wt Readings from Last 3 Encounters:  02/20/21 237 lb (107.5 kg)  02/03/21 237 lb 12.8 oz (107.9 kg)  01/22/21 238 lb (108 kg)     GEN:  Well nourished, well developed in no acute distress HEENT: Normal NECK: No JVD; No carotid bruits LYMPHATICS: No lymphadenopathy CARDIAC: RRR, 2/6 murmur without rubs, gallops RESPIRATORY:  Clear to auscultation without rales, wheezing or rhonchi  ABDOMEN: Soft, non-tender, non-distended MUSCULOSKELETAL:  No edema; No deformity  SKIN: Warm and dry NEUROLOGIC:  Alert and oriented x 3 PSYCHIATRIC:  Normal affect   ASSESSMENT and PLAN   Preoperative cardiovascular examination The patient has an asymptomatic left bundle branch block.  He is undergoing urologic surgery.  This is a relatively low risk procedure from a cardiovascular standpoint.  He is able to perform 4 METS of activity without any issues.  No stress testing is required prior to noncardiac surgery.  I will obtain an echocardiogram to evaluate his murmur further.  Typically only severe valvular lesions such as severe critical aortic stenosis would need to be intervened upon prior to noncardiac surgery.  Certainly if his ejection fraction is abnormally low we would then pursue coronary imaging or cardiac catheterization for further restratification.  Murmur The patient has a murmur.  I will obtain an echocardiogram to evaluate further.  I do not think he has severe aortic  stenosis.  Only if he has severe critical aortic stenosis would his urologic procedure need to potentially be postponed for further evaluation.              Medication Adjustments/Labs and Tests Ordered: Current medicines are reviewed at length with the patient today.  Concerns regarding medicines are outlined above.  Orders Placed This Encounter  Procedures   EKG 12-Lead   ECHOCARDIOGRAM COMPLETE   No orders of the defined types were placed in this encounter.   Patient Instructions  Medication Instructions:  No changes *If you need a refill on your cardiac medications before your next appointment, please call your pharmacy*   Lab Work: none If  you have labs (blood work) drawn today and your tests are completely normal, you will receive your results only by: [place] (if you have MyChart) OR A paper copy in the mail If you have any lab test that is abnormal or we need to change your treatment, we will call you to review the results.   Testing/Procedures: Your physician has requested that you have an echocardiogram. Echocardiography is a painless test that uses sound waves to create images of your heart. It provides your doctor with information about the size and shape of your heart and how well your heart's chambers and valves are working. This procedure takes approximately one hour. There are no restrictions for this procedure.   Follow-Up: At [facility], you and your health needs are our priority.  As part of our continuing mission to provide you with exceptional heart care, we have created designated Provider Care Teams.  These Care Teams include your primary Cardiologist (physician) and Advanced Practice [facility] (APPs -  Physician Assistants and Nurse Practitioners) who all work together to provide you with the care you need, when you need it.   Your next appointment:   12 month(s)  The format for your next appointment:   In Person  Provider:   Lenna Sciara, MD   Other Instructions     Signed, Leon Osmond, MD  02/20/2021 11:50 AM    [place]

## 2021-02-20 ENCOUNTER — Other Ambulatory Visit: Payer: Self-pay

## 2021-02-20 ENCOUNTER — Encounter: Payer: Self-pay | Admitting: Internal Medicine

## 2021-02-20 ENCOUNTER — Ambulatory Visit (INDEPENDENT_AMBULATORY_CARE_PROVIDER_SITE_OTHER): Payer: Medicare Other | Admitting: Internal Medicine

## 2021-02-20 ENCOUNTER — Telehealth: Payer: Self-pay | Admitting: *Deleted

## 2021-02-20 VITALS — BP 142/70 | HR 62 | Ht 70.0 in | Wt 237.0 lb

## 2021-02-20 DIAGNOSIS — R011 Cardiac murmur, unspecified: Secondary | ICD-10-CM | POA: Diagnosis not present

## 2021-02-20 DIAGNOSIS — Z01810 Encounter for preprocedural cardiovascular examination: Secondary | ICD-10-CM | POA: Diagnosis not present

## 2021-02-20 NOTE — Assessment & Plan Note (Signed)
The patient has a murmur.  I will obtain an echocardiogram to evaluate further.  I do not think he has severe aortic stenosis.  Only if he has severe critical aortic stenosis would his urologic procedure need to potentially be postponed for further evaluation.

## 2021-02-20 NOTE — Telephone Encounter (Signed)
CALLED PATIENT TO REMIND OF LAB APPT. FOR 03-04-21, LVM FOR  A RETURN CALL

## 2021-02-20 NOTE — Patient Instructions (Signed)
Medication Instructions:  No changes *If you need a refill on your cardiac medications before your next appointment, please call your pharmacy*   Lab Work: none If you have labs (blood work) drawn today and your tests are completely normal, you will receive your results only by: [place] (if you have MyChart) OR A paper copy in the mail If you have any lab test that is abnormal or we need to change your treatment, we will call you to review the results.   Testing/Procedures: Your physician has requested that you have an echocardiogram. Echocardiography is a painless test that uses sound waves to create images of your heart. It provides your doctor with information about the size and shape of your heart and how well your heart's chambers and valves are working. This procedure takes approximately one hour. There are no restrictions for this procedure.   Follow-Up: At [facility], you and your health needs are our priority.  As part of our continuing mission to provide you with exceptional heart care, we have created designated Provider Care Teams.  These Care Teams include your primary Cardiologist (physician) and Advanced Practice Providers (APPs -  Physician Assistants and Nurse Practitioners) who all work together to provide you with the care you need, when you need it.   Your next appointment:   12 month(s)  The format for your next appointment:   In Person  Provider:   Lenna Sciara, MD   Other Instructions

## 2021-03-03 ENCOUNTER — Other Ambulatory Visit: Payer: Self-pay

## 2021-03-03 ENCOUNTER — Encounter (HOSPITAL_BASED_OUTPATIENT_CLINIC_OR_DEPARTMENT_OTHER): Payer: Self-pay | Admitting: Urology

## 2021-03-03 DIAGNOSIS — S025XXA Fracture of tooth (traumatic), initial encounter for closed fracture: Secondary | ICD-10-CM

## 2021-03-03 HISTORY — DX: Fracture of tooth (traumatic), initial encounter for closed fracture: S02.5XXA

## 2021-03-03 NOTE — Progress Notes (Addendum)
Spoke w/ via phone for pre-op interview---pt Lab needs dos----   none            Lab results------lab appt 03-04-2021 1330 pm for cbc cmp pt ptt COVID test -----patient states asymptomatic no test needed Arrive at -------1145 am 03-06-2021 NPO after MN NO Solid Food.  Clear liquids from MN until---1045 am Med rec completed Medications to take morning of surgery -----use bring /inhaler Diabetic medication -----n/a Patient instructed no nail polish to be worn day of surgery Patient instructed to bring photo id and insurance card day of surgery Patient aware to have Driver (ride ) / caregiver    for 24 hours after surgery  sister charlotte  Patient Special Instructions -----fleets enema am of surgery Pre-Op special Istructions -----none Patient verbalized understanding of instructions that were given at this phone interview. Patient denies shortness of breath, chest pain, fever, cough at this phone interview.   Cardiac clearance note dr Sharen Hint dated 02-20-2021 chart/epic (new left bundle brqnch block) Echo 03-04-2021 chart/epic pending results (pt aware) Ekg 01-22-2021 chart/epic Chest xray 01-22-2021 chart/epic

## 2021-03-04 ENCOUNTER — Encounter (HOSPITAL_COMMUNITY)
Admission: RE | Admit: 2021-03-04 | Discharge: 2021-03-04 | Disposition: A | Discharge status: Home / Self Care | Payer: Medicare Other | Source: Ambulatory Visit | Attending: Urology | Admitting: Urology

## 2021-03-04 ENCOUNTER — Ambulatory Visit (INDEPENDENT_AMBULATORY_CARE_PROVIDER_SITE_OTHER): Payer: Medicare Other

## 2021-03-04 DIAGNOSIS — Z01810 Encounter for preprocedural cardiovascular examination: Secondary | ICD-10-CM | POA: Diagnosis not present

## 2021-03-04 DIAGNOSIS — Z01812 Encounter for preprocedural laboratory examination: Secondary | ICD-10-CM | POA: Diagnosis present

## 2021-03-04 DIAGNOSIS — C61 Malignant neoplasm of prostate: Secondary | ICD-10-CM | POA: Insufficient documentation

## 2021-03-04 DIAGNOSIS — R011 Cardiac murmur, unspecified: Secondary | ICD-10-CM | POA: Diagnosis not present

## 2021-03-04 LAB — COMPREHENSIVE METABOLIC PANEL
ALT: 23 U/L (ref 0–44)
AST: 20 U/L (ref 15–41)
Albumin: 3.6 g/dL (ref 3.5–5.0)
Alkaline Phosphatase: 93 U/L (ref 38–126)
Anion gap: 5 (ref 5–15)
BUN: 14 mg/dL (ref 8–23)
CO2: 24 mmol/L (ref 22–32)
Calcium: 9 mg/dL (ref 8.9–10.3)
Chloride: 110 mmol/L (ref 98–111)
Creatinine, Ser: 0.75 mg/dL (ref 0.61–1.24)
GFR, Estimated: >60 mL/min (ref >60)
Glucose, Bld: 133 mg/dL — ABNORMAL HIGH (ref 70–99)
Potassium: 3.6 mmol/L (ref 3.5–5.1)
Sodium: 139 mmol/L (ref 135–145)
Total Bilirubin: 0.7 mg/dL (ref 0.3–1.2)
Total Protein: 6.9 g/dL (ref 6.5–8.1)

## 2021-03-04 LAB — PROTIME-INR
INR: 1 (ref 0.8–1.2)
Prothrombin Time: 13.4 seconds (ref 11.4–15.2)

## 2021-03-04 LAB — CBC
HCT: 44.6 % (ref 39.0–52.0)
Hemoglobin: 14.7 g/dL (ref 13.0–17.0)
MCH: 29.1 pg (ref 26.0–34.0)
MCHC: 33 g/dL (ref 30.0–36.0)
MCV: 88.3 fL (ref 80.0–100.0)
Platelets: 246 10*3/uL (ref 150–400)
RBC: 5.05 MIL/uL (ref 4.22–5.81)
RDW: 13 % (ref 11.5–15.5)
WBC: 8 10*3/uL (ref 4.0–10.5)
nRBC: 0 % (ref 0.0–0.2)

## 2021-03-04 LAB — APTT: aPTT: 30 seconds (ref 24–36)

## 2021-03-05 ENCOUNTER — Other Ambulatory Visit (HOSPITAL_COMMUNITY): Payer: Medicare Other

## 2021-03-05 ENCOUNTER — Telehealth: Payer: Self-pay | Admitting: *Deleted

## 2021-03-05 ENCOUNTER — Encounter: Payer: Self-pay | Admitting: Internal Medicine

## 2021-03-05 NOTE — Telephone Encounter (Signed)
CALLED PATIENT TO REMIND OF PROCEDURE FOR 03-06-21, LVM FOR A RETURN CALL

## 2021-03-06 ENCOUNTER — Ambulatory Visit (HOSPITAL_BASED_OUTPATIENT_CLINIC_OR_DEPARTMENT_OTHER)
Admission: RE | Admit: 2021-03-06 | Discharge: 2021-03-06 | Disposition: A | Discharge status: Home / Self Care | Payer: Medicare Other | Attending: Urology | Admitting: Urology

## 2021-03-06 ENCOUNTER — Ambulatory Visit (HOSPITAL_BASED_OUTPATIENT_CLINIC_OR_DEPARTMENT_OTHER): Payer: Medicare Other | Admitting: Anesthesiology

## 2021-03-06 ENCOUNTER — Encounter (HOSPITAL_BASED_OUTPATIENT_CLINIC_OR_DEPARTMENT_OTHER): Payer: Self-pay | Admitting: Urology

## 2021-03-06 ENCOUNTER — Telehealth: Payer: Self-pay | Admitting: Internal Medicine

## 2021-03-06 ENCOUNTER — Other Ambulatory Visit: Payer: Self-pay

## 2021-03-06 ENCOUNTER — Encounter (HOSPITAL_BASED_OUTPATIENT_CLINIC_OR_DEPARTMENT_OTHER)
Admission: RE | Disposition: A | Discharge status: Home / Self Care | Payer: Self-pay | Source: Home / Self Care | Attending: Urology

## 2021-03-06 ENCOUNTER — Ambulatory Visit (HOSPITAL_COMMUNITY): Payer: Medicare Other

## 2021-03-06 DIAGNOSIS — R361 Hematospermia: Secondary | ICD-10-CM | POA: Diagnosis not present

## 2021-03-06 DIAGNOSIS — C61 Malignant neoplasm of prostate: Secondary | ICD-10-CM | POA: Insufficient documentation

## 2021-03-06 HISTORY — PX: RADIOACTIVE SEED IMPLANT: SHX5150

## 2021-03-06 HISTORY — PX: SPACE OAR INSTILLATION: SHX6769

## 2021-03-06 HISTORY — DX: Cardiac murmur, unspecified: R01.1

## 2021-03-06 SURGERY — INSERTION, RADIATION SOURCE, PROSTATE
Anesthesia: General

## 2021-03-06 MED ORDER — EPHEDRINE 5 MG/ML INJ
INTRAVENOUS | Status: AC
  Filled 2021-03-06: qty 5

## 2021-03-06 MED ORDER — CIPROFLOXACIN IN D5W 400 MG/200ML IV SOLN
INTRAVENOUS | Status: AC
  Filled 2021-03-06: qty 200

## 2021-03-06 MED ORDER — ACETAMINOPHEN 10 MG/ML IV SOLN: 1000.0000 mg | Freq: Once | INTRAVENOUS | Status: DC | PRN

## 2021-03-06 MED ORDER — EPHEDRINE SULFATE-NACL 50-0.9 MG/10ML-% IV SOSY
PREFILLED_SYRINGE | INTRAVENOUS | Status: DC | PRN
  Administered 2021-03-06: 10 mg via INTRAVENOUS

## 2021-03-06 MED ORDER — SODIUM CHLORIDE (PF) 0.9 % IJ SOLN
INTRAMUSCULAR | Status: DC | PRN
  Administered 2021-03-06: 10 mL via INTRAVENOUS

## 2021-03-06 MED ORDER — IOHEXOL 300 MG/ML  SOLN
INTRAMUSCULAR | Status: DC | PRN
  Administered 2021-03-06: 7 mL

## 2021-03-06 MED ORDER — FENTANYL CITRATE (PF) 100 MCG/2ML IJ SOLN
INTRAMUSCULAR | Status: AC
  Filled 2021-03-06: qty 2

## 2021-03-06 MED ORDER — OXYCODONE HCL 5 MG PO TABS
ORAL_TABLET | ORAL | Status: AC
  Filled 2021-03-06: qty 1

## 2021-03-06 MED ORDER — ONDANSETRON HCL 4 MG/2ML IJ SOLN
INTRAMUSCULAR | Status: DC | PRN
  Administered 2021-03-06: 4 mg via INTRAVENOUS

## 2021-03-06 MED ORDER — CEPHALEXIN 500 MG PO CAPS: 500.0000 mg | ORAL_CAPSULE | Freq: Two times a day (BID) | ORAL | 0 refills | Status: DC

## 2021-03-06 MED ORDER — FENTANYL CITRATE (PF) 100 MCG/2ML IJ SOLN
25.0000 ug | INTRAMUSCULAR | Status: DC | PRN
  Administered 2021-03-06 (×2): 25 ug via INTRAVENOUS

## 2021-03-06 MED ORDER — DOCUSATE SODIUM 100 MG PO CAPS: 100.0000 mg | ORAL_CAPSULE | Freq: Two times a day (BID) | ORAL | 0 refills | Status: DC

## 2021-03-06 MED ORDER — ROCURONIUM BROMIDE 10 MG/ML (PF) SYRINGE
PREFILLED_SYRINGE | INTRAVENOUS | Status: AC
  Filled 2021-03-06: qty 10

## 2021-03-06 MED ORDER — LACTATED RINGERS IV SOLN
INTRAVENOUS | Status: DC
  Administered 2021-03-06: 1000 mL via INTRAVENOUS

## 2021-03-06 MED ORDER — LIDOCAINE 2% (20 MG/ML) 5 ML SYRINGE
INTRAMUSCULAR | Status: DC | PRN
  Administered 2021-03-06: 100 mg via INTRAVENOUS

## 2021-03-06 MED ORDER — CIPROFLOXACIN IN D5W 400 MG/200ML IV SOLN
400.0000 mg | INTRAVENOUS | Status: AC
  Administered 2021-03-06: 400 mg via INTRAVENOUS

## 2021-03-06 MED ORDER — MIDAZOLAM HCL 2 MG/2ML IJ SOLN
INTRAMUSCULAR | Status: AC
  Filled 2021-03-06: qty 2

## 2021-03-06 MED ORDER — FLEET ENEMA 7-19 GM/118ML RE ENEM: 1.0000 | ENEMA | Freq: Once | RECTAL | Status: DC

## 2021-03-06 MED ORDER — PROPOFOL 10 MG/ML IV BOLUS
INTRAVENOUS | Status: DC | PRN
  Administered 2021-03-06: 200 mg via INTRAVENOUS

## 2021-03-06 MED ORDER — LIDOCAINE 2% (20 MG/ML) 5 ML SYRINGE
INTRAMUSCULAR | Status: AC
  Filled 2021-03-06: qty 5

## 2021-03-06 MED ORDER — FENTANYL CITRATE (PF) 100 MCG/2ML IJ SOLN
INTRAMUSCULAR | Status: DC | PRN
  Administered 2021-03-06: 50 ug via INTRAVENOUS
  Administered 2021-03-06: 100 ug via INTRAVENOUS

## 2021-03-06 MED ORDER — SODIUM CHLORIDE 0.9 % IV SOLN
INTRAVENOUS | Status: AC | PRN
  Administered 2021-03-06: 1000 mL

## 2021-03-06 MED ORDER — ROCURONIUM BROMIDE 100 MG/10ML IV SOLN
INTRAVENOUS | Status: DC | PRN
Start: 2021-03-06 — End: 2021-03-06
  Administered 2021-03-06: 70 mg via INTRAVENOUS

## 2021-03-06 MED ORDER — HYDROCODONE-ACETAMINOPHEN 7.5-325 MG PO TABS: 1.0000 | ORAL_TABLET | Freq: Four times a day (QID) | ORAL | 0 refills | Status: DC | PRN

## 2021-03-06 MED ORDER — OXYCODONE HCL 5 MG PO TABS
5.0000 mg | ORAL_TABLET | Freq: Once | ORAL | Status: AC | PRN
  Administered 2021-03-06: 5 mg via ORAL

## 2021-03-06 MED ORDER — DEXAMETHASONE SODIUM PHOSPHATE 10 MG/ML IJ SOLN
INTRAMUSCULAR | Status: DC | PRN
  Administered 2021-03-06: 10 mg via INTRAVENOUS

## 2021-03-06 MED ORDER — WHITE PETROLATUM EX OINT
TOPICAL_OINTMENT | CUTANEOUS | Status: AC
  Filled 2021-03-06: qty 5

## 2021-03-06 MED ORDER — TAMSULOSIN HCL 0.4 MG PO CAPS: 0.4000 mg | ORAL_CAPSULE | Freq: Every day | ORAL | 0 refills | Status: DC

## 2021-03-06 MED ORDER — ONDANSETRON HCL 4 MG/2ML IJ SOLN: 4.0000 mg | Freq: Once | INTRAMUSCULAR | Status: DC | PRN

## 2021-03-06 MED ORDER — SUGAMMADEX SODIUM 200 MG/2ML IV SOLN
INTRAVENOUS | Status: DC | PRN
  Administered 2021-03-06: 200 mg via INTRAVENOUS

## 2021-03-06 MED ORDER — MIDAZOLAM HCL 5 MG/5ML IJ SOLN
INTRAMUSCULAR | Status: DC | PRN
  Administered 2021-03-06: 2 mg via INTRAVENOUS

## 2021-03-06 MED ORDER — OXYCODONE HCL 5 MG/5ML PO SOLN: 5.0000 mg | Freq: Once | ORAL | Status: AC | PRN

## 2021-03-06 SURGICAL SUPPLY — 40 items
BAG DRN RND TRDRP ANRFLXCHMBR (UROLOGICAL SUPPLIES) ×1
BAG URINE DRAIN 2000ML AR STRL (UROLOGICAL SUPPLIES) ×2 IMPLANT
BLADE CLIPPER SENSICLIP SURGIC (BLADE) ×2 IMPLANT
CATH FOLEY 2WAY SLVR  5CC 16FR (CATHETERS) ×1
CATH FOLEY 2WAY SLVR 5CC 16FR (CATHETERS) ×1 IMPLANT
CATH ROBINSON RED A/P 16FR (CATHETERS) IMPLANT
CATH ROBINSON RED A/P 20FR (CATHETERS) ×2 IMPLANT
CLOTH BEACON ORANGE TIMEOUT ST (SAFETY) ×2 IMPLANT
COVER BACK TABLE 60X90IN (DRAPES) ×2 IMPLANT
COVER MAYO STAND STRL (DRAPES) ×2 IMPLANT
DRSG TEGADERM 4X4.75 (GAUZE/BANDAGES/DRESSINGS) ×2 IMPLANT
DRSG TEGADERM 8X12 (GAUZE/BANDAGES/DRESSINGS) ×4 IMPLANT
GAUZE SPONGE 4X4 12PLY STRL LF (GAUZE/BANDAGES/DRESSINGS) ×2 IMPLANT
GEL ULTRASOUND 20GR AQUASONIC (MISCELLANEOUS) ×2 IMPLANT
GLOVE SURG ENC MOIS LTX SZ6.5 (GLOVE) ×2 IMPLANT
GLOVE SURG ENC MOIS LTX SZ7.5 (GLOVE) ×2 IMPLANT
GLOVE SURG ENC MOIS LTX SZ8 (GLOVE) IMPLANT
GLOVE SURG ORTHO LTX SZ8.5 (GLOVE) ×2 IMPLANT
GLOVE SURG UNDER POLY LF SZ6.5 (GLOVE) IMPLANT
GOWN STRL REUS W/TWL LRG LVL3 (GOWN DISPOSABLE) ×2 IMPLANT
GRID BRACH TEMP 18GA 2.8X3X.75 (MISCELLANEOUS) ×2 IMPLANT
HOLDER FOLEY CATH W/STRAP (MISCELLANEOUS) ×2 IMPLANT
IMPL SPACEOAR VUE SYSTEM (Spacer) ×1 IMPLANT
IMPLANT SPACEOAR VUE SYSTEM (Spacer) ×2 IMPLANT
IV NS 1000ML (IV SOLUTION) ×2
IV NS 1000ML BAXH (IV SOLUTION) ×1 IMPLANT
KIT TURNOVER CYSTO (KITS) ×2 IMPLANT
NEEDLE BRACHY 18G 5PK (NEEDLE) ×8 IMPLANT
NEEDLE BRACHY 18G SINGLE (NEEDLE) IMPLANT
NEEDLE PK MORGANSTERN STABILIZ (NEEDLE) ×2 IMPLANT
PACK CYSTO (CUSTOM PROCEDURE TRAY) ×2 IMPLANT
QUICK LINK I-125 SEEDS ×2 IMPLANT
SHEATH ULTRASOUND LF (SHEATH) IMPLANT
SHEATH ULTRASOUND LTX NONSTRL (SHEATH) IMPLANT
SURGILUBE 2OZ TUBE FLIPTOP (MISCELLANEOUS) ×2 IMPLANT
SUT BONE WAX W31G (SUTURE) IMPLANT
SYR 10ML LL (SYRINGE) ×2 IMPLANT
TOWEL OR 17X26 10 PK STRL BLUE (TOWEL DISPOSABLE) ×2 IMPLANT
UNDERPAD 30X36 HEAVY ABSORB (UNDERPADS AND DIAPERS) ×4 IMPLANT
WATER STERILE IRR 500ML POUR (IV SOLUTION) ×2 IMPLANT

## 2021-03-06 NOTE — Progress Notes (Signed)
  Radiation Oncology         [phone]) [phone] ________________________________  Name: Riku Buttery MRN: [id number]  Date: 03/06/2021  DOB: July 01, 1950       Prostate Seed Implant  ID:[id number], Samul Dada, MD  No ref. provider found    DIAGNOSIS:  70 y.o. 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 Oncology History  Malignant neoplasm of prostate ([place])  10/06/2020 Cancer Staging   Staging form: Prostate, AJCC 8th Edition - Clinical stage from 10/06/2020: Stage IIC (cT2c, cN0, cM0, PSA: 12.5, Grade Group: 3) - Signed by Freeman Caldron, PA-C on 01/22/2021 Histopathologic type: Adenocarcinoma, NOS Stage prefix: Initial diagnosis Prostate specific antigen (PSA) range: 10 to 19 Gleason primary pattern: 4 Gleason secondary pattern: 3 Gleason score: 7 Histologic grading system: 5 grade system Number of biopsy cores examined: 12 Number of biopsy cores positive: 9 Location of positive needle core biopsies: Both sides    12/01/2020 Initial Diagnosis   Malignant neoplasm of prostate ([facility])     No diagnosis found.  PROCEDURE: Insertion of radioactive I-125 seeds into the prostate gland.  RADIATION DOSE: 145 Gy, definitive therapy.  TECHNIQUE: Tyquan Carmickle was brought to the operating room with the urologist. He was placed in the dorsolithotomy position. He was catheterized and a rectal tube was inserted. The perineum was shaved, prepped and draped. The ultrasound probe was then introduced into the rectum to see the prostate gland.  TREATMENT DEVICE: A needle grid was attached to the ultrasound probe stand and anchor needles were placed.  3D PLANNING: The prostate was imaged in 3D using a sagittal sweep of the prostate probe. These images were transferred to the planning computer. There, the prostate, urethra and rectum were defined on each axial reconstructed image. Then, the software created an optimized 3D plan and a few seed positions were  adjusted. The quality of the plan was reviewed using [facility] information for the target and the following two organs at risk:  Urethra and Rectum.  Then the accepted plan was printed and handed off to the radiation therapist.  Under my supervision, the custom loading of the seeds and spacers was carried out and loaded into sealed vicryl sleeves.  These pre-loaded needles were then placed into the needle holder.Marland Kitchen  PROSTATE VOLUME STUDY:  Using transrectal ultrasound the volume of the prostate was verified to be 44.34 cc.  SPECIAL TREATMENT PROCEDURE/SUPERVISION AND HANDLING: The pre-loaded needles were then delivered under sagittal guidance. A total of 17 needles were used to deposit 58 seeds in the prostate gland. The individual seed activity was 0.544 mCi.  SpaceOAR:  Yes  COMPLEX SIMULATION: At the end of the procedure, an anterior radiograph of the pelvis was obtained to document seed positioning and count. Cystoscopy was performed to check the urethra and bladder.  MICRODOSIMETRY: At the end of the procedure, the patient was emitting 0.101 mR/hr at 1 meter. Accordingly, he was considered safe for hospital discharge.  PLAN: The patient will return to the radiation oncology clinic for post implant CT dosimetry in three weeks.   ________________________________  Sheral Apley Tammi Klippel, M.D.

## 2021-03-06 NOTE — Op Note (Signed)
Preoperative diagnosis: Stage I (T1cNxMx) Prostate cancer Postoperative diagnosis: Same   Procedure: Prostate brachytherapy seed implant, Cystoscopy, SpaceOar biodegradable gel placement   Surgeon: Junious Silk   Radiation oncologist: Tammi Klippel   Anesthesia: Gen.   Indication for procedure: 70 year old with stage II prostate cancer who elected to proceed with prostate brachytherapy.   Findings: On fluoroscopic imaging there was adequate coverage of the prostate. On cystoscopy the urethra appeared normal, the prostatic urethra appeared normal, the trigone and ureteral orifices appeared normal with clear efflux. The bladder mucosa appeared normal. There were no stones, foreign bodies or seeds visualized in the bladder.   Dose: 145 Gy    Description of procedure: After consent was obtained patient brought to the operating room. After adequate anesthesia he is placed in lithotomy position and the transrectal ultrasound probe and perineal template positioned. Catheters and brachytherapy seeds were placed per Dr. Johny Shears plan. A total of  catheters and 58 active sources (I-125) were placed. The anchoring needles, template and ultrasound were removed. Scout flouro imaging was obtained of the implant. The Foley was removed. Another image obtained.   The 18-gauge needle was then inserted approximately 1 to 2 cm anterior to the anal opening and directed under fluoroscopic guidance into the perirectal fat between the anterior rectal wall and the prostate capsule down to the mid-gland. Midline needle position was confirmed in the sagittal and axial views to verify the tip was in the perirectal fat.  Small amounts of saline were injected to hydrodissect the space between the prostate and the anterior rectal wall.  Axial imaging was viewed to confirm the needle was in the correct location in the mid gland and centered.  Aspiration confirmed no intravascular access.  The saline syringe was carefully disconnected  maintaining the desired needle position and the hydrogel was attached to the needle.  Under ultrasound guidance in the sagittal view a smooth continuous injection was done over about 12 seconds delivering the hydrogel into the space between the prostate and rectal wall.  The needle was withdrawn.   The patient was prepped again and cystoscopy was performed which was noted to be normal.  He was awakened taken to the recovery room in stable condition.   Complications: None   Blood loss: Minimal   Specimens: None   Drains: none    Disposition: Patient stable to PACU.

## 2021-03-06 NOTE — Anesthesia Postprocedure Evaluation (Signed)
Anesthesia Post Note  Patient: Engineer, petroleum  Procedure(s) Performed: RADIOACTIVE SEED IMPLANT/BRACHYTHERAPY IMPLANT SPACE OAR INSTILLATION     Patient location during evaluation: PACU Anesthesia Type: General Level of consciousness: awake and alert Pain management: pain level controlled Vital Signs Assessment: post-procedure vital signs reviewed and stable Respiratory status: spontaneous breathing, nonlabored ventilation, respiratory function stable and patient connected to nasal cannula oxygen Cardiovascular status: blood pressure returned to baseline and stable Postop Assessment: no apparent nausea or vomiting Anesthetic complications: no   No notable events documented.  Last Vitals:  Vitals:   03/06/21 1153 03/06/21 1553  BP: (!) 178/95 126/62  Pulse: (!) 57   Resp: 18   Temp: 36.5 C (!) 36.3 C  SpO2: 98%     Last Pain:  Vitals:   03/06/21 1212  TempSrc:   PainSc: 2                  Jada Fass S

## 2021-03-06 NOTE — Discharge Instructions (Signed)
PROSTATE CANCER TREATMENT WITH RADIOACTIVE IODINE-125 SEED IMPLANT  This instruction sheet is intended to discuss implantation of Iodine-125 seeds as treatment for cancer of the prostate. It will explain in detail what you may expect from this treatment and what precautions are necessary as a result of the treatment. Iodine-125 emits a relatively low energy radiation. The radioactive seeds are surgically implanted directly into the prostate gland. Most of the radiation is contained within the prostate gland. A very small amount is present outside the body.The precautions that we ask you to take are to ensure that those around you are protected from unnecessary radiation. The principles of radiation safety that you need to understand are:  DISTANCE: The further a person is from the radioactive implant the less radiation they will be receiving. The amount of radiation received falls off quite rapidly with distance. More specific guidelines are given in the table on the last page.  TIME: The amount of radiation a person is exposed to is directly proportional to the amount of time that is spent in close proximity to the radioactive implant. Very little radiation will be received during short periods. See the table on the last page for more specific guideline.  CHILDREN UNDER AGE 26 Children should not be allowed to sit on your lap or otherwise be in very close contact for more than a few minutes for the first 6-8 weeks following the implant. You may affectionately greet (hug/kiss) a child for a short period of time, but remember, the longer you are in close proximity with that child the more radiation they are being exposed to. At a distance of 6 feet there is no limit to the length of time you may spend together. See specific guidelines on the last page.  PREGNANT OR POSSIBLY PREGNANT WOMEN Pregnant women should avoid prolonged close physical contact with you for the first 6-8 weeks after implant. At a  distance of 6 feet there is no limit to the length of time you may spend together. Pregnant women or possibly pregnant women can safely be in close contact with you for a limited period of time. See the last page for guidelines.  FAMILY RELATIONS You may sleep in the same bed as your partner (provided she is not pregnant or under the age of 60). Sexual intercourse, using a condom, may be resumed 2 weeks after the implant. Your semen may be discolored, dark brown or black. This is normal and is the result of bleeding that may have occurred during the implant. After 3-4 weeks it will not be necessary to use a condom.  DAILY ACTIVITIES You may resume normal activities in a few days (example: work, shopping, church) without the risk of harmful radiation exposure to those around you provided you keep in mind the time and distance precautions. Objects that you touch or item that you use do not become radioactive. Linens, clothing, tableware, and dishes may be used by other persons without special precautions. Your bodily wastes (urine and stool) are not radioactive.  SPECIAL PRECAUTIONS It is possible to lose implanted Iodine-125 seed(s) through urination. Although it is possible to pass seeds indefinitely, it is most likely to occur immediately after catheter removal. To prevent this from happening the catheter that was in place during the implant procedure is removed immediately after the implant and a cystoscopy procedure is performed. The process of removing the catheter and the cystoscopy procedure should dislodge and remove any seeds that are not firmly imbedded in the prostate  tissue. However, you should watch for seeds if/when you remove your catheter at home. The seeds are silver colored and the size of a grain of rice. In the unlikely event that a seed is seen after urination, simply flush the seed down the toilet. The seed should not be handled with your fingers, not even with a glove or napkin. A  spoon or tweezers can be used to pick up a seed. The Radiation Oncology department is open Monday - Friday from 8:00 am to 5:30 pm with a Radiation Oncologist on call at all times. He or she may be reached by calling [phone]. If you are to be hospitalized or if death should occur, your family should notify the Runner, broadcasting/film/video.  SIDE EFFECTS There are very few side effects associate with the implant procedure. Minor burning with urination, weak stream, hesitancy, intermittency, frequency, mild pain or feeling unable to pass your urine freely are common and usually stop in one to four months. If these symptoms are extremely uncomfortable, contact your physician.  RADIATION SAFETY GUIDELINES PROSTATE CANCER TREATMENT WITH RADIOACTIVE IODINE-125 SEED IMPLANT  The following guidelines will limit exposure to less than naturally occurring background radiation.  PERSONS AGE 37-45 (if able to become pregnant)  FOR 8 WEEKS FOLLOWING IMPLANT  At a distance of 1 foot: limit time to less than 2 hours/week At a distance of 3 feet: limit time to 20 hours/week At a distance of 6 feet: no restrictions  AFTER 8 WEEKS No restrictions  CHILDREN UNDER AGE 37, PREGNANT WOMEN OR POSSIBLY PREGNANT WOMEN  FOR 8 WEEKS FOLLOWING IMPLANT At a distance of 1 foot: limit time to 10 minutes/week At a distance of 3 feet: limit time to 2 hours/week At a distance of 6 feet: no restrictions  AFTER 8 WEEKS No restrictions  PERSONS OVER THE AGE OF 45 AND DO NOT EXPECT TO HAVE ANY MORE CHILDREN No restrictions  Updated by [facility] in January 2020      Post Anesthesia Home Care Instructions  Activity: Get plenty of rest for the remainder of the day. A responsible individual must stay with you for 24 hours following the procedure.  For the next 24 hours, DO NOT: -Drive a car -Paediatric nurse -Drink alcoholic beverages -Take any medication unless instructed by your physician -Make any legal  decisions or sign important papers.  Meals: Start with liquid foods such as gelatin or soup. Progress to regular foods as tolerated. Avoid greasy, spicy, heavy foods. If nausea and/or vomiting occur, drink only clear liquids until the nausea and/or vomiting subsides. Call your physician if vomiting continues.  Special Instructions/Symptoms: Your throat may feel dry or sore from the anesthesia or the breathing tube placed in your throat during surgery. If this causes discomfort, gargle with warm salt water. The discomfort should disappear within 24 hours.

## 2021-03-06 NOTE — Telephone Encounter (Signed)
    I reviewed the note form Dr. Ali Lowe and I personally reviewed the echo images.   THe patient has normal LV function.  Aortic valve leaflets open well.  He does not have significant aortic stenosis. Await final read for more detail.    OK to proceed with planned surgery today from a cardiac standpoint.  Jettie Booze, MD

## 2021-03-06 NOTE — H&P (Signed)
H&P  Chief Complaint: prostate cancer   History of Present Illness: Pt with prostate cancer here for brachytherapy.    1) Prostate cancer - pt with intermediate risk Prostate cancer diagnosed June 2022.    June 2022 Unfavorable Intermediate Risk PCa PSA 12.5 T2c (bilateral firm) Prostate 45 grams Gleason 4+3=7, four cores Gleason 3+4=7, four cores Gleason 3+3=6, one core 9/12 cores 10-80% MSK: risk of SV invasion and LAD > 20%   Staging:  Aug 2022 PSMA PET - prostate activity. No mets. Pulm nodules consider chest CT (Feb 2023)   He is doing well. He typically voids with a good flow. No frequency or urgency. Noc x 0-1. Still some hematospermia. No dysuria or gross hematuria.     He takes vitamin B6 and B12. He hauls sheds and levels them.   Past Medical History:  Diagnosis Date   Acid reflux    Asthma    Broken tooth 03/03/2021   right upper takes clindomycin prn for   Heart murmur    Left bundle branch block 01/22/2021   echo done 03-04-2021   Pneumothorax 2001   chemical related   Prostate CA ([place]) 02/2021   Past Surgical History:  Procedure Laterality Date   COLONOSCOPY WITH PROPOFOL N/A 06/13/2020   Procedure: COLONOSCOPY WITH PROPOFOL;  Surgeon: Eloise Harman, DO;  [facility];  Service: Endoscopy;  Laterality: N/A;  ASA I/ 8:00   EYE SURGERY Bilateral    ioc for cataracts   LUNG SURGERY  2001   POLYPECTOMY  06/13/2020   Procedure: POLYPECTOMY;  Surgeon: Eloise Harman, DO;  Location: [facility];  Service: Endoscopy;;   PROSTATE BIOPSY      Home Medications:  No medications prior to admission.   Allergies:  Allergies  Allergen Reactions   Latex Rash    Elevates blood pressure    Family History  Problem Relation Age of Onset   Cancer Mother    Heart failure Father    Hypertension Father    Diabetes Sister    Social History:  reports that he has never smoked. He has never used smokeless tobacco. He reports that he does not  drink alcohol and does not use drugs.  ROS: A complete review of systems was performed.  All systems are negative except for pertinent findings as noted. Review of Systems  All other systems reviewed and are negative.   Physical Exam:  Vital signs in last 24 hours:   General:  Alert and oriented, No acute distress HEENT: Normocephalic, atraumatic Neck: No JVD or lymphadenopathy Cardiovascular: Regular rate and rhythm Lungs: Regular rate and effort Abdomen: Soft, nontender, nondistended, no abdominal masses Back: No CVA tenderness Extremities: No edema Neurologic: Grossly intact  Laboratory Data:  No results found for this or any previous visit (from the past 24 hour(s)). No results found for this or any previous visit (from the past 240 hour(s)). Creatinine: Recent Labs    03/04/21 1331  CREATININE 0.75    Impression/Assessment:  Prostate cancer -  Plan:  I discussed with the patient the nature, potential benefits, risks and alternatives to prostate brachytherapy, cystoscopy and SpaceOar, including side effects of the proposed treatment, the likelihood of the patient achieving the goals of the procedure, and any potential problems that might occur during the procedure or recuperation. We discussed risk of injury to bladder, urethra, rectum, etc. All questions answered. Patient elects to proceed.    Festus Aloe 03/06/2021

## 2021-03-06 NOTE — Telephone Encounter (Signed)
Received call from operator and spoke with Marlowe Kays in the surgical scheduling department. Patient is scheduled for prostate surgery today. Has prostate Cancer.  She is asking if patient can be cleared for this surgery. Can call Marlowe Kays at [phone] or put note in Epic

## 2021-03-06 NOTE — Anesthesia Procedure Notes (Signed)
Procedure Name: Intubation Date/Time: 03/06/2021 2:29 PM Performed by: Gwyndolyn Saxon, CRNA Pre-anesthesia Checklist: Patient identified, Emergency Drugs available, Suction available and Patient being monitored Patient Re-evaluated:Patient Re-evaluated prior to induction Oxygen Delivery Method: Circle System Utilized Preoxygenation: Pre-oxygenation with 100% oxygen Induction Type: IV induction Ventilation: Mask ventilation without difficulty Laryngoscope Size: Mac and 4 Grade View: Grade III Tube type: Oral Tube size: 7.5 mm Number of attempts: 2 (Gr 4 view with Mil 2, Gr 3 view with Mac 4. +etco2) Airway Equipment and Method: Stylet and Bite block Placement Confirmation: ETT inserted through vocal cords under direct vision, positive ETCO2 and breath sounds checked- equal and bilateral Secured at: 22 cm Tube secured with: Tape Dental Injury: Teeth and Oropharynx as per pre-operative assessment

## 2021-03-06 NOTE — Telephone Encounter (Signed)
New ssage:    Marlowe Kays from Dr Junious Silk office needs clearance for this patient surgery today. Echo from 03-04-21, have not been read

## 2021-03-06 NOTE — Anesthesia Preprocedure Evaluation (Signed)
Anesthesia Evaluation  Patient identified by MRN, date of birth, ID band Patient awake    Reviewed: Allergy & Precautions, H&P , NPO status , Patient's Chart, lab work & pertinent test results  Airway Mallampati: II  TM Distance: >3 FB Neck ROM: Full    Dental  (+) Chipped, Dental Advisory Given   Pulmonary neg pulmonary ROS,    Pulmonary exam normal breath sounds clear to auscultation       Cardiovascular Normal cardiovascular exam Rhythm:Regular Rate:Normal  LBBB   Neuro/Psych negative neurological ROS  negative psych ROS   GI/Hepatic Neg liver ROS, GERD  ,  Endo/Other  negative endocrine ROS  Renal/GU negative Renal ROS  negative genitourinary   Musculoskeletal negative musculoskeletal ROS (+)   Abdominal   Peds negative pediatric ROS (+)  Hematology negative hematology ROS (+)   Anesthesia Other Findings   Reproductive/Obstetrics negative OB ROS                             Anesthesia Physical Anesthesia Plan  ASA: 2  Anesthesia Plan: General   Post-op Pain Management:    Induction: Intravenous  PONV Risk Score and Plan: 2 and Ondansetron, Dexamethasone and Treatment may vary due to age or medical condition  Airway Management Planned: Oral ETT  Additional Equipment:   Intra-op Plan:   Post-operative Plan: Extubation in OR  Informed Consent: I have reviewed the patients History and Physical, chart, labs and discussed the procedure including the risks, benefits and alternatives for the proposed anesthesia with the patient or authorized representative who has indicated his/her understanding and acceptance.     Dental advisory given  Plan Discussed with: CRNA and Surgeon  Anesthesia Plan Comments:         Anesthesia Quick Evaluation

## 2021-03-06 NOTE — Transfer of Care (Signed)
Immediate Anesthesia Transfer of Care Note  Patient: Engineer, petroleum  Procedure(s) Performed: RADIOACTIVE SEED IMPLANT/BRACHYTHERAPY IMPLANT SPACE OAR INSTILLATION  Patient Location: PACU  Anesthesia Type:General  Level of Consciousness: awake, alert , oriented and patient cooperative  Airway & Oxygen Therapy: Patient Spontanous Breathing  Post-op Assessment: Report given to RN and Post -op Vital signs reviewed and stable  Post vital signs: Reviewed and stable  Last Vitals:  Vitals Value Taken Time  BP 126/62 03/06/21 1553  Temp    Pulse 66 03/06/21 1555  Resp 11 03/06/21 1555  SpO2 96 % 03/06/21 1555  Vitals shown include unvalidated device data.  Last Pain:  Vitals:   03/06/21 1212  TempSrc:   PainSc: 2       Patients Stated Pain Goal: 5 (21/11/73 5670)  Complications: No notable events documented.

## 2021-03-09 ENCOUNTER — Encounter (HOSPITAL_BASED_OUTPATIENT_CLINIC_OR_DEPARTMENT_OTHER): Payer: Self-pay | Admitting: Urology

## 2021-03-09 LAB — ECHOCARDIOGRAM COMPLETE
AR max vel: 1.81 cm2
AV Area VTI: 1.73 cm2
AV Area mean vel: 1.88 cm2
AV Mean grad: 6 mmHg
AV Peak grad: 11.7 mmHg
Ao pk vel: 1.71 m/s
Area-P 1/2: 2.62 cm2
S' Lateral: 3.69 cm

## 2021-03-09 NOTE — Progress Notes (Signed)
50 mcg of Fentanyl wasted in steri cycle with Catie Cornetto, RN after patient was discharged.

## 2021-03-10 ENCOUNTER — Telehealth: Payer: Self-pay

## 2021-03-10 NOTE — Telephone Encounter (Signed)
Received call from patient requesting when he is able to take a bath and return to work.  Please advise.

## 2021-03-10 NOTE — Telephone Encounter (Signed)
Patient called, made aware, and voiced understanding. 

## 2021-03-10 NOTE — Telephone Encounter (Signed)
Patient can shower, wait to take a bath or submerge the perineum until two weeks post-op or 03/20/2021. He can return to work 04/15/2021.

## 2021-03-17 ENCOUNTER — Telehealth: Payer: Self-pay | Admitting: *Deleted

## 2021-03-17 NOTE — Telephone Encounter (Signed)
CALLED PATIENT TO REMIND OF POST SEED APPTS. FOR 03-19-21- ARRIVAL TIME - 10:30 AM @ [facility], LVM FOR A RETURN CALL

## 2021-03-18 NOTE — Progress Notes (Signed)
  Radiation Oncology         [phone]) [phone] ________________________________  Name: Leon Hawkins MRN: [id number]  Date: 03/19/2021  DOB: Dec 19, 1950  COMPLEX SIMULATION NOTE  NARRATIVE:  The patient was brought to the [facility] today following prostate seed implantation approximately one month ago.  Identity was confirmed.  All relevant records and images related to the planned course of therapy were reviewed.  Then, the patient was set-up supine.  CT images were obtained.  The CT images were loaded into the planning software.  Then the prostate and rectum were contoured.  Treatment planning then occurred.  The implanted iodine 125 seeds were identified by the physics staff for projection of radiation distribution  I have requested : 3D Simulation  I have requested a DVH of the following structures: Prostate and rectum.    ________________________________  Sheral Apley Tammi Klippel, M.D.

## 2021-03-18 NOTE — Progress Notes (Signed)
Radiation Oncology         [phone]) [phone] ________________________________  Name: Lorn Butcher MRN: [id number]  Date: 03/19/2021  DOB: 1950/09/30  Post-Seed Follow-Up Visit Note  CC: Neale Burly, MD  Festus Aloe, MD  Diagnosis:   70 y.o. 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    ICD-10-CM   1. Malignant neoplasm of prostate ([place])  C61       Interval Since Last Radiation:  2 weeks 03/06/21:  Insertion of radioactive I-125 seeds into the prostate gland; 145 Gy, definitive therapy with placement of SpaceOAR gel.  Narrative:  The patient returns today for routine follow-up.  He is complaining of increased urinary frequency and urinary hesitation symptoms. He filled out a questionnaire regarding urinary function today providing and overall IPSS score of 9 characterizing his symptoms as mild-moderate with urgency and nocturia x3.  His pre-implant score was 4.  He specifically denies dysuria, gross hematuria, straining to void, incomplete bladder emptying or incontinence.  He has stopped taking the Flomax and feels that his urinary symptoms are manageable without any medication.  He reports a healthy appetite and is maintaining his weight.  He denies any abdominal pain or bowel symptoms.  Overall, he is pleased with his progress to date.  ALLERGIES:  is allergic to latex.  Meds: Current Outpatient Medications  Medication Sig Dispense Refill   albuterol (VENTOLIN HFA) 108 (90 Base) MCG/ACT inhaler Inhale 1-2 puffs into the lungs every 6 (six) hours as needed for wheezing or shortness of breath.     aspirin EC 81 MG tablet Take 81 mg by mouth daily. Swallow whole.     cephALEXin (KEFLEX) 500 MG capsule Take 1 capsule (500 mg total) by mouth 2 (two) times daily. 10 capsule 0   clindamycin (CLEOCIN) 300 MG capsule Take 300 mg by mouth as needed. For top right tooth prn broke off 2 weeks ago     docusate sodium (COLACE) 100 MG  capsule Take 1 capsule (100 mg total) by mouth 2 (two) times daily. 10 capsule 0   HYDROcodone-acetaminophen (NORCO) 7.5-325 MG tablet Take 1 tablet by mouth every 6 (six) hours as needed for moderate pain. 10 tablet 0   Multiple Vitamin (MULTIVITAMIN) tablet Take 1 tablet by mouth daily.     Pyridoxine HCl (VITAMIN B6) 200 MG TABS Take 200 mg by mouth daily. Takes 500 mg daily.     tamsulosin (FLOMAX) 0.4 MG CAPS capsule Take 1 capsule (0.4 mg total) by mouth daily after supper. 30 capsule 0   vitamin B-12 (CYANOCOBALAMIN) 500 MCG tablet Take 500 mcg by mouth daily.     No current facility-administered medications for this visit.    Physical Findings: In general this is a well appearing Caucasian male in no acute distress. He's alert and oriented x4 and appropriate throughout the examination. Cardiopulmonary assessment is negative for acute distress and he exhibits normal effort.   Lab Findings: Lab Results  Component Value Date   WBC 8.0 03/04/2021   HGB 14.7 03/04/2021   HCT 44.6 03/04/2021   MCV 88.3 03/04/2021   PLT 246 03/04/2021    Radiographic Findings:  Patient underwent CT imaging in our clinic for post implant dosimetry. The CT will be reviewed by Dr. Tammi Klippel to confirm there is an adequate distribution of radioactive seeds throughout the prostate gland and ensure that there are no seeds in or near the rectum.  We suspect the final  radiation plan and dosimetry will show appropriate coverage of the prostate gland. He understands that we will call and inform him of any unexpected findings on further review of his imaging and dosimetry.  Impression/Plan: 70 y.o. 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 The patient is recovering from the effects of radiation. His urinary symptoms should gradually improve over the next 4-6 months. We talked about this today. He is encouraged by his improvement already and is otherwise pleased  with his outcome. We also talked about long-term follow-up for prostate cancer following seed implant. He understands that ongoing PSA determinations and digital rectal exams will help perform surveillance to rule out disease recurrence. He does not currently have any follow up appointments scheduled with Dr. Junious Silk to his knowledge.  We discussed that typically, the follow-up visit and first posttreatment PSA occur approximately 3 months after the procedure and that he should likely hear from one of the staff at [facility] to schedule a follow-up visit in February 2023.  He understands what to expect with his PSA measures. Patient was also educated today about some of the long-term effects from radiation including a small risk for rectal bleeding and possibly erectile dysfunction. We talked about some of the general management approaches to these potential complications. However, I did encourage the patient to contact our office or return at any point if he has questions or concerns related to his previous radiation and prostate cancer.    Nicholos Johns, PA-C

## 2021-03-19 ENCOUNTER — Encounter: Payer: Self-pay | Admitting: Urology

## 2021-03-19 ENCOUNTER — Ambulatory Visit
Admission: RE | Admit: 2021-03-19 | Discharge: 2021-03-19 | Disposition: A | Discharge status: Home / Self Care | Payer: Medicare Other | Source: Ambulatory Visit | Attending: Radiation Oncology | Admitting: Radiation Oncology

## 2021-03-19 ENCOUNTER — Other Ambulatory Visit: Payer: Self-pay

## 2021-03-19 ENCOUNTER — Ambulatory Visit
Admission: RE | Admit: 2021-03-19 | Discharge: 2021-03-19 | Disposition: A | Discharge status: Home / Self Care | Payer: Medicare Other | Source: Ambulatory Visit | Attending: Urology | Admitting: Urology

## 2021-03-19 VITALS — BP 113/80 | HR 62 | Temp 96.2°F | Resp 18 | Wt 236.0 lb

## 2021-03-19 DIAGNOSIS — C61 Malignant neoplasm of prostate: Secondary | ICD-10-CM | POA: Diagnosis not present

## 2021-03-19 DIAGNOSIS — R350 Frequency of micturition: Secondary | ICD-10-CM | POA: Diagnosis not present

## 2021-03-19 DIAGNOSIS — Z79899 Other long term (current) drug therapy: Secondary | ICD-10-CM | POA: Insufficient documentation

## 2021-03-19 DIAGNOSIS — Z7982 Long term (current) use of aspirin: Secondary | ICD-10-CM | POA: Insufficient documentation

## 2021-03-19 DIAGNOSIS — R3911 Hesitancy of micturition: Secondary | ICD-10-CM | POA: Diagnosis not present

## 2021-03-19 NOTE — Progress Notes (Addendum)
Patient reports general joint pain, dorsalgia, and RT hip pain 5/10. No other symptoms reported at this time. Meaningful use complete.  Currently on Flomax 0.4mg  and NO urology follow-up scheduled at this time, per [facility].  BP 113/80 (BP Location: Right Arm, Patient Position: Sitting, Cuff Size: Normal)   Pulse 62   Temp (!) 96.2 F (35.7 C) (Oral)   Resp 18   Wt 236 lb (107 kg)   SpO2 99%   BMI 33.86 kg/m

## 2021-03-23 ENCOUNTER — Encounter: Payer: Self-pay | Admitting: Radiation Oncology

## 2021-03-23 DIAGNOSIS — C61 Malignant neoplasm of prostate: Secondary | ICD-10-CM | POA: Diagnosis not present

## 2021-04-20 NOTE — Progress Notes (Signed)
°  Radiation Oncology         [phone]) [phone] ________________________________  Name: Leon Hawkins MRN: [id number]  Date: 03/23/2021  DOB: [birth year]  3D Planning Note   Prostate Brachytherapy Post-Implant Dosimetry  Diagnosis: 71 y.o. 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  Narrative: On a previous date, [name] returned following prostate seed implantation for post implant planning. He underwent CT scan complex simulation to delineate the three-dimensional structures of the pelvis and demonstrate the radiation distribution.  Since that time, the seed localization, and complex isodose planning with dose volume histograms have now been completed.  Results:   Prostate Coverage - The dose of radiation delivered to the 90% or more of the prostate gland (D90) was 115.07% of the prescription dose. This exceeds our goal of greater than 90%. Rectal Sparing - The volume of rectal tissue receiving the prescription dose or higher was 0.0 cc. This falls under our thresholds tolerance of 1.0 cc.  Impression: The prostate seed implant appears to show adequate target coverage and appropriate rectal sparing.  Plan:  The patient will continue to follow with urology for ongoing PSA determinations. I would anticipate a high likelihood for local tumor control with minimal risk for rectal morbidity.  ________________________________  Sheral Apley Tammi Klippel, M.D.

## 2021-04-23 NOTE — Progress Notes (Signed)
RN placed call to [facility] in [place] to obtain follow up appointment with Dr. Junious Silk in February on patient's behalf.  Clinic currently does not have providers schedule for February.  Verbalized they will place a call back to patient once able to schedule.

## 2021-04-28 ENCOUNTER — Telehealth: Payer: Self-pay

## 2021-04-28 NOTE — Telephone Encounter (Signed)
-----   Message from Festus Aloe, MD sent at 04/21/2021  9:12 AM EST ----- He needs to see me around 06/08/2021 or after for post-op appointment (prostate brachytherapy) - thanks. Happy New Year!   Dr Johnette Abraham

## 2021-04-28 NOTE — Telephone Encounter (Signed)
Patient called with no answer. Message left to return call to office. 

## 2021-04-29 NOTE — Telephone Encounter (Signed)
-----   Message from Festus Aloe, MD sent at 04/21/2021  9:12 AM EST ----- He needs to see me around 06/08/2021 or after for post-op appointment (prostate brachytherapy) - thanks. Happy New Year!   Dr Johnette Abraham

## 2021-04-29 NOTE — Telephone Encounter (Signed)
Patient made aware. Appointment made and mailed to patient.

## 2021-06-08 ENCOUNTER — Other Ambulatory Visit: Payer: Self-pay

## 2021-06-08 ENCOUNTER — Encounter: Payer: Self-pay | Admitting: Urology

## 2021-06-08 ENCOUNTER — Ambulatory Visit (INDEPENDENT_AMBULATORY_CARE_PROVIDER_SITE_OTHER): Payer: Medicare Other | Admitting: Urology

## 2021-06-08 VITALS — BP 161/94 | HR 59

## 2021-06-08 DIAGNOSIS — R3915 Urgency of urination: Secondary | ICD-10-CM

## 2021-06-08 DIAGNOSIS — R918 Other nonspecific abnormal finding of lung field: Secondary | ICD-10-CM

## 2021-06-08 DIAGNOSIS — C61 Malignant neoplasm of prostate: Secondary | ICD-10-CM

## 2021-06-08 DIAGNOSIS — R3912 Poor urinary stream: Secondary | ICD-10-CM | POA: Diagnosis not present

## 2021-06-08 LAB — URINALYSIS, ROUTINE W REFLEX MICROSCOPIC
Bilirubin, UA: NEGATIVE
Glucose, UA: NEGATIVE
Ketones, UA: NEGATIVE
Leukocytes,UA: NEGATIVE
Nitrite, UA: NEGATIVE
Protein,UA: NEGATIVE
RBC, UA: NEGATIVE
Specific Gravity, UA: 1.025 (ref 1.005–1.030)
Urobilinogen, Ur: 0.2 mg/dL (ref 0.2–1.0)
pH, UA: 5.5 (ref 5.0–7.5)

## 2021-06-08 MED ORDER — TAMSULOSIN HCL 0.4 MG PO CAPS: 0.4000 mg | ORAL_CAPSULE | Freq: Every day | ORAL | 11 refills | Status: DC

## 2021-06-08 NOTE — Progress Notes (Signed)
06/08/2021 10:55 AM   Leon Hawkins Nov 23, 1950 [id number]  Referring provider: Neale Burly, MD [place],  [place] [address]  No chief complaint on file.   HPI:  1) Prostate cancer - he was treated with brachytherapy Nov 2022 for intermediate risk Prostate cancer diagnosed June 2022.    Biopsy: June 2022 Unfavorable Intermediate Risk PCa PSA 12.5 T2c (bilateral firm) Prostate 45 grams Gleason 4+3=7, four cores; Gleason 3+4=7, four cores; Gleason 3+3=6, one core 9/12 cores 10-80% MSK: risk of SV invasion and LAD > 20%  Staging: Aug 2022 PSMA PET - no metastatic disease, prostate activity. Non-specific pulmonary nodules.     He is doing well. He typically voids with a good flow. No frequency or urgency. Noc x 0-1. Still some hematospermia.    He takes vitamin B6 and B12. He hauls sheds and levels them.   He returns in management of the above. He had right sciatica symptoms p brachy. He responded to gebapentin. His PSA with Dr. Sherrie Sport two weeks ago was "5.6". His UA is clear. Since surgery he's had weak stream and frequency.    PMH: Past Medical History:  Diagnosis Date   Acid reflux    Asthma    Broken tooth 03/03/2021   right upper takes clindomycin prn for   Heart murmur    Left bundle branch block 01/22/2021   echo done 03-04-2021   Pneumothorax 2001   chemical related   Prostate CA ([place]) 02/2021    Surgical History: Past Surgical History:  Procedure Laterality Date   COLONOSCOPY WITH PROPOFOL N/A 06/13/2020   Procedure: COLONOSCOPY WITH PROPOFOL;  Surgeon: Eloise Harman, DO;  [facility];  Service: Endoscopy;  Laterality: N/A;  ASA I/ 8:00   EYE SURGERY Bilateral    ioc for cataracts   LUNG SURGERY  2001   POLYPECTOMY  06/13/2020   Procedure: POLYPECTOMY;  Surgeon: Eloise Harman, DO;  [facility];  Service: Endoscopy;;   PROSTATE BIOPSY     RADIOACTIVE SEED IMPLANT N/A 03/06/2021   Procedure: RADIOACTIVE  SEED IMPLANT/BRACHYTHERAPY IMPLANT;  Surgeon: Festus Aloe, MD;  [facility];  Service: Urology;  Laterality: N/A;  ONLY NEED 90 MIN FOR ALL   SPACE OAR INSTILLATION N/A 03/06/2021   Procedure: SPACE OAR INSTILLATION;  Surgeon: Festus Aloe, MD;  Location: [facility];  Service: Urology;  Laterality: N/A;    Home Medications:  Allergies as of 06/08/2021       Reactions   Latex Rash   Elevates blood pressure        Medication List        Accurate as of June 08, 2021 10:55 AM. If you have any questions, ask your nurse or doctor.          albuterol 108 (90 Base) MCG/ACT inhaler Commonly known as: VENTOLIN HFA Inhale 1-2 puffs into the lungs every 6 (six) hours as needed for wheezing or shortness of breath.   aspirin EC 81 MG tablet Take 81 mg by mouth daily. Swallow whole.   cephALEXin 500 MG capsule Commonly known as: KEFLEX Take 1 capsule (500 mg total) by mouth 2 (two) times daily.   clindamycin 300 MG capsule Commonly known as: CLEOCIN Take 300 mg by mouth as needed. For top right tooth prn broke off 2 weeks ago   docusate sodium 100 MG capsule Commonly known as: Colace Take 1 capsule (100 mg total) by mouth 2 (two)  times daily.   HYDROcodone-acetaminophen 7.5-325 MG tablet Commonly known as: NORCO Take 1 tablet by mouth every 6 (six) hours as needed for moderate pain.   multivitamin tablet Take 1 tablet by mouth daily.   tamsulosin 0.4 MG Caps capsule Commonly known as: FLOMAX Take 1 capsule (0.4 mg total) by mouth daily after supper.   vitamin B-12 500 MCG tablet Commonly known as: CYANOCOBALAMIN Take 500 mcg by mouth daily.   Vitamin B6 200 MG Tabs Take 200 mg by mouth daily. Takes 500 mg daily.        Allergies:  Allergies  Allergen Reactions   Latex Rash    Elevates blood pressure    Family History: Family History  Problem Relation Age of Onset   Cancer Mother    Heart failure  Father    Hypertension Father    Diabetes Sister     Social History:  reports that he has never smoked. He has never used smokeless tobacco. He reports that he does not drink alcohol and does not use drugs.   Physical Exam: There were no vitals taken for this visit.  Constitutional:  Alert and oriented, No acute distress. HEENT: [place] AT, moist mucus membranes.  Trachea midline, no masses. Cardiovascular: No clubbing, cyanosis, or edema. Respiratory: Normal respiratory effort, no increased work of breathing. GI: Abdomen is soft, nontender, nondistended, no abdominal masses GU: No CVA tenderness Skin: No rashes, bruises or suspicious lesions. Neurologic: Grossly intact, no focal deficits, moving all 4 extremities. Psychiatric: Normal mood and affect.  Laboratory Data: Lab Results  Component Value Date   WBC 8.0 03/04/2021   HGB 14.7 03/04/2021   HCT 44.6 03/04/2021   MCV 88.3 03/04/2021   PLT 246 03/04/2021    Lab Results  Component Value Date   CREATININE 0.75 03/04/2021    No results found for: PSA  No results found for: TESTOSTERONE  No results found for: HGBA1C  Urinalysis    Component Value Date/Time   APPEARANCEUR Clear 09/01/2020 1440   GLUCOSEU Negative 09/01/2020 1440   BILIRUBINUR Negative 09/01/2020 1440   PROTEINUR Negative 09/01/2020 1440   NITRITE Negative 09/01/2020 1440   LEUKOCYTESUR Negative 09/01/2020 1440    Lab Results  Component Value Date   LABMICR Comment 09/01/2020    Notes: op note reviewed, dr Chiropodist note, PET scan report    Assessment & Plan:    1) PCa - PSA going down appropriately. Check PSA in 3 mo   2) pulm nodules - check chest CT May 2023 (at 6 months)   3) wk st, urgency - discussed the nature r/b/a to tamsulosin and he'll start   No follow-ups on file.  Festus Aloe, MD  [facility]  [address] [place], [place] [address] [phone]

## 2021-08-24 ENCOUNTER — Other Ambulatory Visit: Payer: Medicare Other

## 2021-08-31 ENCOUNTER — Ambulatory Visit (INDEPENDENT_AMBULATORY_CARE_PROVIDER_SITE_OTHER): Payer: Medicare Other | Admitting: Urology

## 2021-08-31 VITALS — BP 159/90 | HR 76

## 2021-08-31 DIAGNOSIS — N401 Enlarged prostate with lower urinary tract symptoms: Secondary | ICD-10-CM | POA: Diagnosis not present

## 2021-08-31 DIAGNOSIS — R3912 Poor urinary stream: Secondary | ICD-10-CM

## 2021-08-31 DIAGNOSIS — Z8546 Personal history of malignant neoplasm of prostate: Secondary | ICD-10-CM | POA: Diagnosis not present

## 2021-08-31 DIAGNOSIS — C61 Malignant neoplasm of prostate: Secondary | ICD-10-CM

## 2021-08-31 MED ORDER — TAMSULOSIN HCL 0.4 MG PO CAPS: 0.4000 mg | ORAL_CAPSULE | Freq: Every day | ORAL | 3 refills | Status: AC

## 2021-08-31 NOTE — Progress Notes (Signed)
? ?08/31/2021 ?9:56 AM  ? ?Leon Hawkins ?01/08/1951 ?[id number] ? ?Referring provider: Neale Burly, MD ?[place] ?[place],  [place] [address] ? ?No chief complaint on file. ? ? ?HPI: ? ? ?F/u -  ? ?1) Prostate cancer - pt with intermediate risk Prostate cancer diagnosed June 2022 and treated with brachytherapy Nov 2022 (145 Gy, 17 needles, 58 active sources).  ?  ?Biopsy: June 2022 - PSA 12.5, T2c (bilateral firm), Prostate 45 grams; Gleason 4+3=7, four cores ?Gleason 3+4=7, four cores, Gleason 3+3=6, one core 9/12 cores 10-80% ?MSK: risk of SV invasion and LAD > 20%  ?  ?Staging:  ?Aug 2022 PSMA PET - prostate activity. No mets. Pulm nodules consider chest CT (Feb 2023) ? ?Today, Leon Hawkins is seen for the above. His PSA with Dr. Sherrie Sport was around 3 in April 2023. He started tamsulosin for weak stream and urgency but ran out. Needs a refill. No gross hematuria.  ? ?He takes vitamin B6 and B12. He hauls sheds and levels them.  ? ?PMH: ?Past Medical History:  ?Diagnosis Date  ? Acid reflux   ? Asthma   ? Broken tooth 03/03/2021  ? right upper takes clindomycin prn for  ? Heart murmur   ? Left bundle branch block 01/22/2021  ? echo done 03-04-2021  ? Pneumothorax 2001  ? chemical related  ? Prostate CA ([place]) 02/2021  ? ? ?Surgical History: ?Past Surgical History:  ?Procedure Laterality Date  ? COLONOSCOPY WITH PROPOFOL N/A 06/13/2020  ? Procedure: COLONOSCOPY WITH PROPOFOL;  Surgeon: Eloise Harman, DO;  [facility];  Service: Endoscopy;  Laterality: N/A;  ASA I/ 8:00  ? EYE SURGERY Bilateral   ? ioc for cataracts  ? LUNG SURGERY  2001  ? POLYPECTOMY  06/13/2020  ? Procedure: POLYPECTOMY;  Surgeon: Eloise Harman, DO;  [facility];  Service: Endoscopy;;  ? PROSTATE BIOPSY    ? RADIOACTIVE SEED IMPLANT N/A 03/06/2021  ? Procedure: RADIOACTIVE SEED IMPLANT/BRACHYTHERAPY IMPLANT;  Surgeon: Festus Aloe, MD;  Location: [facility];  Service: Urology;  Laterality: N/A;   ONLY NEED 90 MIN FOR ALL  ? SPACE OAR INSTILLATION N/A 03/06/2021  ? Procedure: SPACE OAR INSTILLATION;  Surgeon: Festus Aloe, MD;  Location: [facility];  Service: Urology;  Laterality: N/A;  ? ? ?Home Medications:  ?Allergies as of 08/31/2021   ? ?   Reactions  ? Latex Rash  ? Elevates blood pressure  ? ?  ? ?  ?Medication List  ?  ? ?  ? Accurate as of Aug 31, 2021  9:56 AM. If you have any questions, ask your nurse or doctor.  ?  ?  ? ?  ? ?albuterol 108 (90 Base) MCG/ACT inhaler ?Commonly known as: VENTOLIN HFA ?Inhale 1-2 puffs into the lungs every 6 (six) hours as needed for wheezing or shortness of breath. ?  ?aspirin EC 81 MG tablet ?Take 81 mg by mouth daily. Swallow whole. ?  ?gabapentin 300 MG capsule ?Commonly known as: NEURONTIN ?Take 300 mg by mouth 3 (three) times daily. ?  ?multivitamin tablet ?Take 1 tablet by mouth daily. ?  ?tamsulosin 0.4 MG Caps capsule ?Commonly known as: FLOMAX ?Take 1 capsule (0.4 mg total) by mouth daily after supper. ?  ?vitamin B-12 500 MCG tablet ?Commonly known as: CYANOCOBALAMIN ?Take 500 mcg by mouth daily. ?  ?Vitamin B6 200 MG Tabs ?Take 200 mg by mouth daily. Takes 500 mg daily. ?  ? ?  ? ? ?  Allergies:  ?Allergies  ?Allergen Reactions  ? Latex Rash  ?  Elevates blood pressure  ? ? ?Family History: ?Family History  ?Problem Relation Age of Onset  ? Cancer Mother   ? Heart failure Father   ? Hypertension Father   ? Diabetes Sister   ? ? ?Social History:  reports that he has never smoked. He has never used smokeless tobacco. He reports that he does not drink alcohol and does not use drugs. ? ? ?Physical Exam: ?There were no vitals taken for this visit.  ?Constitutional:  Alert and oriented, No acute distress. ?HEENT: [place] AT, moist mucus membranes.  Trachea midline, no masses. ?Cardiovascular: No clubbing, cyanosis, or edema. ?Respiratory: Normal respiratory effort, no increased work of breathing. ?GI: Abdomen is soft, nontender, nondistended, no  abdominal masses ?GU: No CVA tenderness ?Skin: No rashes, bruises or suspicious lesions. ?Neurologic: Grossly intact, no focal deficits, moving all 4 extremities. ?Psychiatric: Normal mood and affect. ? ?Laboratory Data: ?Lab Results  ?Component Value Date  ? WBC 8.0 03/04/2021  ? HGB 14.7 03/04/2021  ? HCT 44.6 03/04/2021  ? MCV 88.3 03/04/2021  ? PLT 246 03/04/2021  ? ? ?Lab Results  ?Component Value Date  ? CREATININE 0.75 03/04/2021  ? ? ?No results found for: PSA ? ?No results found for: TESTOSTERONE ? ?No results found for: HGBA1C ? ?Urinalysis ?   ?Component Value Date/Time  ? APPEARANCEUR Clear 06/08/2021 1117  ? GLUCOSEU Negative 06/08/2021 1117  ? BILIRUBINUR Negative 06/08/2021 1117  ? PROTEINUR Negative 06/08/2021 1117  ? NITRITE Negative 06/08/2021 1117  ? LEUKOCYTESUR Negative 06/08/2021 1117  ? ? ?Lab Results  ?Component Value Date  ? LABMICR Comment 06/08/2021  ? ? ?Pertinent Imaging: ?N/a ? ? ?Assessment & Plan:   ? ?1. Prostate cancer - PSA dropping appropriately.  ? ?- Urinalysis, Routine w reflex microscopic ? ?2. Weak stream, bph - tamsulosin refilled  ? ?No follow-ups on file. ? ?Festus Aloe, MD ? ?[facility] [place]  ?[place], [place] [address] ?[phone] ? ? ?

## 2022-01-25 ENCOUNTER — Other Ambulatory Visit: Payer: Medicare Other

## 2022-01-25 DIAGNOSIS — C61 Malignant neoplasm of prostate: Secondary | ICD-10-CM

## 2022-01-26 LAB — PSA: Prostate Specific Ag, Serum: 1.3 ng/mL (ref 0.0–4.0)

## 2022-02-01 ENCOUNTER — Encounter: Payer: Self-pay | Admitting: Urology

## 2022-02-01 ENCOUNTER — Ambulatory Visit (INDEPENDENT_AMBULATORY_CARE_PROVIDER_SITE_OTHER): Payer: Medicare Other | Admitting: Urology

## 2022-02-01 VITALS — BP 134/80 | HR 73

## 2022-02-01 DIAGNOSIS — C61 Malignant neoplasm of prostate: Secondary | ICD-10-CM

## 2022-02-01 DIAGNOSIS — N401 Enlarged prostate with lower urinary tract symptoms: Secondary | ICD-10-CM

## 2022-02-01 DIAGNOSIS — N138 Other obstructive and reflux uropathy: Secondary | ICD-10-CM | POA: Diagnosis not present

## 2022-02-01 NOTE — Progress Notes (Unsigned)
02/01/2022 9:19 AM   Leon Hawkins 12/11/1950 [id number]  Referring provider: Neale Burly, MD [place],  [place] [address]  No chief complaint on file.   HPI:  F/u -    1) Prostate cancer - s/p brachytherapy Nov 2022 (145 Gy, 17 needles, 58 active sources) for an intermediate risk Prostate cancer diagnosed June 2022.   Biopsy: June 2022 - PSA 12.5, T2c (bilateral firm), Prostate 45 grams; Gleason 4+3=7, four cores Gleason 3+4=7, four cores, Gleason 3+3=6, one core 9/12 cores 10-80% MSK: risk of SV invasion and LAD > 20%    Staging:  Aug 2022 PSMA PET - prostate activity. No mets. Pulm nodules. CXR normal Oct 2022.    Today, Leon Hawkins is seen for the above. His PSA with Dr. Sherrie Sport was around 3 in April 2023. His Oct 2023 PSA is down to 1.3.  He started tamsulosin for weak stream and urgency. AUASS = 7. Now on tamsulosin PRN. He has some urgency. No gross hematuria.    He takes vitamin B6 and B12. He hauls sheds and levels them.    PMH: Past Medical History:  Diagnosis Date   Acid reflux    Asthma    Broken tooth 03/03/2021   right upper takes clindomycin prn for   Heart murmur    Left bundle branch block 01/22/2021   echo done 03-04-2021   Pneumothorax 2001   chemical related   Prostate CA ([place]) 02/2021    Surgical History: Past Surgical History:  Procedure Laterality Date   COLONOSCOPY WITH PROPOFOL N/A 06/13/2020   Procedure: COLONOSCOPY WITH PROPOFOL;  Surgeon: Eloise Harman, DO;  [facility];  Service: Endoscopy;  Laterality: N/A;  ASA I/ 8:00   EYE SURGERY Bilateral    ioc for cataracts   LUNG SURGERY  2001   POLYPECTOMY  06/13/2020   Procedure: POLYPECTOMY;  Surgeon: Eloise Harman, DO;  [facility];  Service: Endoscopy;;   PROSTATE BIOPSY     RADIOACTIVE SEED IMPLANT N/A 03/06/2021   Procedure: RADIOACTIVE SEED IMPLANT/BRACHYTHERAPY IMPLANT;  Surgeon: Festus Aloe, MD;  Location: [facility];  Service: Urology;  Laterality: N/A;  ONLY NEED 90 MIN FOR ALL   SPACE OAR INSTILLATION N/A 03/06/2021   Procedure: SPACE OAR INSTILLATION;  Surgeon: Festus Aloe, MD;  Location: [facility];  Service: Urology;  Laterality: N/A;    Home Medications:  Allergies as of 02/01/2022       Reactions   Latex Rash   Elevates blood pressure        Medication List        Accurate as of February 01, 2022  9:19 AM. If you have any questions, ask your nurse or doctor.          albuterol 108 (90 Base) MCG/ACT inhaler Commonly known as: VENTOLIN HFA Inhale 1-2 puffs into the lungs every 6 (six) hours as needed for wheezing or shortness of breath.   aspirin EC 81 MG tablet Take 81 mg by mouth daily. Swallow whole.   gabapentin 300 MG capsule Commonly known as: NEURONTIN Take 300 mg by mouth 3 (three) times daily.   multivitamin tablet Take 1 tablet by mouth daily.   tamsulosin 0.4 MG Caps capsule Commonly known as: FLOMAX Take 1 capsule (0.4 mg total) by mouth daily after supper.   vitamin B-12 500 MCG tablet Commonly known as: CYANOCOBALAMIN Take 500 mcg by mouth daily.   Vitamin B6 200  MG Tabs Take 200 mg by mouth daily. Takes 500 mg daily.        Allergies:  Allergies  Allergen Reactions   Latex Rash    Elevates blood pressure    Family History: Family History  Problem Relation Age of Onset   Cancer Mother    Heart failure Father    Hypertension Father    Diabetes Sister     Social History:  reports that he has never smoked. He has never used smokeless tobacco. He reports that he does not drink alcohol and does not use drugs.   Physical Exam: BP 134/80   Pulse 73   Constitutional:  Alert and oriented, No acute distress. HEENT: [place] AT, moist mucus membranes.  Trachea midline, no masses. Cardiovascular: No clubbing, cyanosis, or edema. Respiratory: Normal respiratory effort, no increased work of breathing. GI: Abdomen is soft,  nontender, nondistended, no abdominal masses GU: No CVA tenderness Skin: No rashes, bruises or suspicious lesions. Neurologic: Grossly intact, no focal deficits, moving all 4 extremities. Psychiatric: Normal mood and affect.  Laboratory Data: Lab Results  Component Value Date   WBC 8.0 03/04/2021   HGB 14.7 03/04/2021   HCT 44.6 03/04/2021   MCV 88.3 03/04/2021   PLT 246 03/04/2021    Lab Results  Component Value Date   CREATININE 0.75 03/04/2021    No results found for: "PSA"  No results found for: "TESTOSTERONE"  No results found for: "HGBA1C"  Urinalysis    Component Value Date/Time   APPEARANCEUR Clear 06/08/2021 1117   GLUCOSEU Negative 06/08/2021 1117   BILIRUBINUR Negative 06/08/2021 1117   PROTEINUR Negative 06/08/2021 1117   NITRITE Negative 06/08/2021 1117   LEUKOCYTESUR Negative 06/08/2021 1117    Lab Results  Component Value Date   LABMICR Comment 06/08/2021     Assessment & Plan:    1. Prostate cancer ([place]) PSA declining appropriately.   2. BPH with LUTS - continue tamsulosin.    No follow-ups on file.  Festus Aloe, MD  [facility]  [address] [place], [place] [address] [phone]

## 2022-03-29 ENCOUNTER — Encounter: Payer: Self-pay | Admitting: Internal Medicine

## 2022-03-29 ENCOUNTER — Ambulatory Visit: Payer: Medicare Other | Attending: Internal Medicine | Admitting: Internal Medicine

## 2022-03-29 VITALS — BP 120/86 | HR 63 | Ht 70.0 in | Wt 252.4 lb

## 2022-03-29 DIAGNOSIS — C61 Malignant neoplasm of prostate: Secondary | ICD-10-CM | POA: Insufficient documentation

## 2022-03-29 DIAGNOSIS — Z6834 Body mass index (BMI) 34.0-34.9, adult: Secondary | ICD-10-CM | POA: Diagnosis not present

## 2022-03-29 DIAGNOSIS — I447 Left bundle-branch block, unspecified: Secondary | ICD-10-CM | POA: Insufficient documentation

## 2022-03-29 NOTE — Progress Notes (Signed)
Cardiology Office Note:    Date:  03/29/2022   ID:  Leon Hawkins, DOB 04-Feb-1951, MRN [id number]  PCP:  Leon Burly, MD   [facility] Providers Cardiologist:  Lenna Sciara, MD Referring MD: Leon Burly, MD   Chief Complaint/Reason for Referral: Cardiology follow-up  ASSESSMENT:    1. Left bundle branch block   2. Malignant neoplasm of prostate ([place])   3. BMI 34.0-34.9,adult     PLAN:    In order of problems listed above: 1.  Left bundle branch block: This is of no clinical consequence.  Will keep follow-up open-ended with me.  The patient does not really harbor an indication for primary prevention aspirin.  I did speak to the patient about this.  He has been taking this because it helps him sleep.  He has had no issues with GI bleeding. 2.  Prostate cancer: This is being managed by other providers. 3.  Elevated BMI: Diet and exercise modification for now.  I will see him back in 6 months and if his weight is not down I will refer him to pharmacy for further recommendations.             Dispo:  Return in about 6 months (around 09/28/2022).      Medication Adjustments/Labs and Tests Ordered: Current medicines are reviewed at length with the patient today.  Concerns regarding medicines are outlined above.  The following changes have been made:  no change   Labs/tests ordered: Orders Placed This Encounter  Procedures   EKG 12-Lead    Medication Changes: No orders of the defined types were placed in this encounter.    Current medicines are reviewed at length with the patient today.  The patient does not have concerns regarding medicines.   History of Present Illness:    FOCUSED PROBLEM LIST:   1.  Prostate cancer 2.  Left bundle branch block 3.  BMI of 34  Leon Hawkins is a 71 y.o. male with the indicated medical problems here for recommendations regarding preoperative cardiac assessment for noncardiac surgery.  He had an EKG which demonstrated  a new left bundle branch block.  He also had a CXR which remarked on cardiomegaly.  In terms of his symptoms the patient still works full-time doing rather strenuous physical labor.  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  He will move this shed out to where it needs to be and oftentimes he needs to carry 40 pound boxes in both hands.  He does this without any exertional angina.  He denies any exertional shortness of breath.  He does have a history of a pneumothorax requiring thoracotomy back 20 years ago.  This was the result of some medical exposure.  He sometimes notices that if he lays in a certain way he will be short of breath.  This has been a chronic issue.  He is also been told he has had a murmur for some time.  He is required no emergency room visits or hospitalizations for any reason.  He denies any presyncope, syncope, palpitations, paroxysmal, dyspnea, or orthopnea.  He does have some positional dyspnea due to scar tissue from his thoracotomy surgery.  Plan: Obtain echocardiogram and proceed to urologic surgery.  Today:   The patient is doing well.  He denies any severe routine shortness of breath, exertional angina, presyncope or syncope.  Unfortunately he has gained around 15 pounds since I saw him last.  He attributes this to poor diet and  lack of exercise.  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  He fortunately has not required any emergency room visits or hospitalizations.          Current Medications: Current Meds  Medication Sig   albuterol (VENTOLIN HFA) 108 (90 Base) MCG/ACT inhaler Inhale 1-2 puffs into the lungs every 6 (six) hours as needed for wheezing or shortness of breath.   aspirin EC 81 MG tablet Take 81 mg by mouth daily. Swallow whole.   gabapentin (NEURONTIN) 300 MG capsule Take 300 mg by mouth 3 (three) times daily.   Multiple Vitamin  (MULTIVITAMIN) tablet Take 1 tablet by mouth daily.   Pyridoxine HCl (VITAMIN B6) 200 MG TABS Take 200 mg by mouth daily. Takes 500 mg daily.   tamsulosin (FLOMAX) 0.4 MG CAPS capsule Take 1 capsule (0.4 mg total) by mouth daily after supper.   vitamin B-12 (CYANOCOBALAMIN) 500 MCG tablet Take 500 mcg by mouth daily.     Allergies:    Latex   Social History:   Social History   Tobacco Use   Smoking status: Never   Smokeless tobacco: Never  Vaping Use   Vaping Use: Never used  Substance Use Topics   Alcohol use: Never   Drug use: Never     Family Hx: Family History  Problem Relation Age of Onset   Cancer Mother    Heart failure Father    Hypertension Father    Diabetes Sister      Review of Systems:   Please see the history of present illness.    All other systems reviewed and are negative.     EKGs/Labs/Other Test Reviewed:    EKG:   EKG performed today that I personally reviewed demonstrates sinus rhythm and left bundle branch block  Prior CV studies:  TTE 2022: 1. Left ventricular ejection fraction, by estimation, is 55 to 60%. The  left ventricle has normal function. The left ventricle has no regional  wall motion abnormalities. Left ventricular diastolic parameters were  normal.   2. Right ventricular systolic function is normal. The right ventricular  size is normal.   3. Left atrial size was moderately dilated.   4. The mitral valve is normal in structure. Trivial mitral valve  regurgitation. No evidence of mitral stenosis.   5. The aortic valve is tricuspid. There is mild calcification of the  aortic valve. Aortic valve regurgitation is mild. Aortic valve sclerosis  is present, with no evidence of aortic valve stenosis.   6. The inferior vena cava is normal in size with greater than 50%  respiratory variability, suggesting right atrial pressure of 3 mmHg.   Other studies Reviewed: Review of the additional studies/records demonstrates: No imaging  available demonstrating aortic atherosclerosis or coronary artery calcification  Recent Labs: No results found for requested labs within last 365 days.   Recent Lipid Panel No results found for: "CHOL", "TRIG", "HDL", "LDLCALC", "LDLDIRECT"  Risk Assessment/Calculations:                Physical Exam:    VS:  BP 120/86   Pulse 63   Ht '5\' 10"'$  (1.778 m)   Wt 252 lb 6.4 oz (114.5 kg)   SpO2 95%   BMI 36.22 kg/m    Wt Readings from Last 3 Encounters:  03/29/22 252 lb 6.4 oz (114.5 kg)  03/19/21 236  lb (107 kg)  03/06/21 252 lb (114.3 kg)    GENERAL:  No apparent distress, AOx3 HEENT:  No carotid bruits, +2 carotid impulses, no scleral icterus CAR: RRR no murmurs, gallops, rubs, or thrills RES:  Clear to auscultation bilaterally ABD:  Soft, nontender, nondistended, positive bowel sounds x 4 VASC:  +2 radial pulses, +2 carotid pulses, palpable pedal pulses NEURO:  CN 2-12 grossly intact; motor and sensory grossly intact PSYCH:  No active depression or anxiety EXT:  No edema, ecchymosis, or cyanosis  Signed, Early Osmond, MD  03/29/2022 4:35 PM    [place], [place], [place]  [address] Phone: [phone]; Fax: [phone]   Note:  This document was prepared using Dragon voice recognition software and may include unintentional dictation errors.

## 2022-03-29 NOTE — Patient Instructions (Signed)
Medication Instructions:  Your physician recommends that you continue on your current medications as directed. Please refer to the Current Medication list given to you today.  *If you need a refill on your cardiac medications before your next appointment, please call your pharmacy*   Lab Work: None. If you have labs (blood work) drawn today and your tests are completely normal, you will receive your results only by: [place] (if you have MyChart) OR A paper copy in the mail If you have any lab test that is abnormal or we need to change your treatment, we will call you to review the results.   Testing/Procedures: None.   Follow-Up: At [facility], you and your health needs are our priority.  As part of our continuing mission to provide you with exceptional heart care, we have created designated Provider Care Teams.  These Care Teams include your primary Cardiologist (physician) and Advanced Practice Providers (APPs -  Physician Assistants and Nurse Practitioners) who all work together to provide you with the care you need, when you need it.  We recommend signing up for the patient portal called "MyChart".  Sign up information is provided on this After Visit Summary.  MyChart is used to connect with patients for Virtual Visits (Telemedicine).  Patients are able to view lab/test results, encounter notes, upcoming appointments, etc.  Non-urgent messages can be sent to your provider as well.   To learn more about what you can do with MyChart, go to [email or web address].    Your next appointment:   6 month(s)  The format for your next appointment:   In Person  Provider:   Early Osmond, MD      Important Information About Sugar

## 2023-01-21 NOTE — Progress Notes (Signed)
Cardiology Office Note:   Date:  01/21/2023  ID:  Leon Hawkins, DOB 18-Jun-1950, MRN [id number] PCP:  Toma Deiters, MD  [facility] Providers Cardiologist:  Alverda Skeans, MD Referring MD: Toma Deiters, MD   Chief Complaint/Reason for Referral: Cardiology follow-up ASSESSMENT:    PATIENT DID NOT APPEAR FOR APPOINTMENT   1. Left bundle branch block   2. Malignant neoplasm of prostate (HCC)   3. BMI 34.0-34.9,adult     PLAN:   In order of problems listed above: 1.  Left bundle branch block: Monitor. 2.  Prostate cancer: This is being followed by other providers. 3.  Elevated BMI:  PATIENT DID NOT APPEAR FOR APPOINTMENT   History of Present Illness:      FOCUSED PROBLEM LIST:   Prostate cancer Left bundle branch block BMI 34     November 2022:  Leon Hawkins is a 72 y.o. male with the indicated medical problems here for recommendations regarding preoperative cardiac assessment for noncardiac surgery.  He had an EKG which demonstrated a new left bundle branch block.  He also had a CXR which remarked on cardiomegaly.  In terms of his symptoms the patient still works full-time doing rather strenuous physical labor.  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  He will move this shed out to where it needs to be and oftentimes he needs to carry 40 pound boxes in both hands.  He does this without any exertional angina.  He denies any exertional shortness of breath.  He does have a history of a pneumothorax requiring thoracotomy back 20 years ago.  This was the result of some medical exposure.  He sometimes notices that if he lays in a certain way he will be short of breath.  This has been a chronic issue.  He is also been told he has had a murmur for some time.  He is required no emergency room visits or hospitalizations for any reason.  He denies any presyncope, syncope, palpitations, paroxysmal, dyspnea, or orthopnea.  He does have some positional dyspnea due to scar  tissue from his thoracotomy surgery.  Plan: Obtain echocardiogram and proceed to urologic surgery.   December 2023: In the interim the patient had an echocardiogram which demonstrated preserved LV function and mild aortic valve calcification with aortic valve sclerosis.  The patient is doing well.  He denies any severe routine shortness of breath, exertional angina, presyncope or syncope.  Unfortunately he has gained around 15 pounds since I saw him last.  He attributes this to poor diet and lack of exercise.  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  He fortunately has not required any emergency room visits or hospitalizations.  Plan: Continue current therapy.     Current Medications: No outpatient medications have been marked as taking for the 01/24/23 encounter (Appointment) with Orbie Pyo, MD.      Review of Systems:   Please see the history of present illness.    All other systems reviewed and are negative.      EKGs/Labs/Other Test Reviewed:   EKG:    EKG Interpretation Date/Time:    Ventricular Rate:    PR Interval:    QRS Duration:    QT Interval:    QTC Calculation:   R Axis:      Text Interpretation:  Risk Assessment/Calculations:        Physical Exam:     Signed, Orbie Pyo, MD  01/21/2023 12:00 PM    [facility] [address] [place], [place], [place]  [address] Phone: [phone]; Fax: [phone]   Note:  This document was prepared using Dragon voice recognition software and may include unintentional dictation errors.

## 2023-01-24 ENCOUNTER — Ambulatory Visit: Payer: Medicare Other | Attending: Internal Medicine | Admitting: Internal Medicine

## 2023-01-24 DIAGNOSIS — I447 Left bundle-branch block, unspecified: Secondary | ICD-10-CM

## 2023-01-24 DIAGNOSIS — Z6834 Body mass index (BMI) 34.0-34.9, adult: Secondary | ICD-10-CM

## 2023-01-24 DIAGNOSIS — C61 Malignant neoplasm of prostate: Secondary | ICD-10-CM

## 2023-01-25 ENCOUNTER — Encounter: Payer: Self-pay | Admitting: Internal Medicine

## 2023-02-02 IMAGING — PT NM PET TUM IMG SKULL BASE T - THIGH
1 series · 1 of 1 positions shown · non-contrast
Comparison: None.

CLINICAL DATA: New diagnosis of prostate cancer with PSA of 12.4.

EXAM:
NUCLEAR MEDICINE PET SKULL BASE TO THIGH
TECHNIQUE: 8.2 mCi F18 Piflufolastat (Pylarify) was injected intravenously.
Full-ring PET imaging was performed from the skull base to thigh
after the radiotracer. CT data was obtained and used for attenuation
correction and anatomic localization.

[Series 1086: results mm oncology reading · 3.0mm · 0.94mm/px · 1 of 1 slices shown]
[im 1/1]
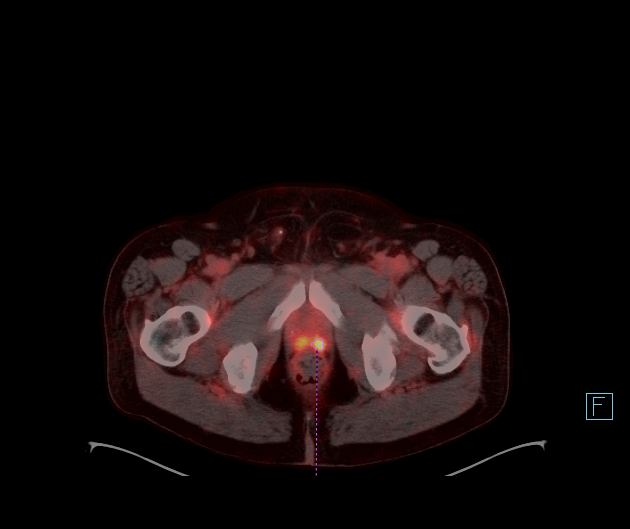

[1 of 1 positions shown; findings below may reference images not displayed]

FINDINGS: NECK

No radiotracer activity in neck lymph nodes.

Incidental CT finding: No cervical adenopathy.

CHEST

No tracer accumulation with thoracic nodes or lungs.

Incidental CT finding: Aortic atherosclerosis. Mild cardiomegaly. 3
mm nodule along the right minor fissure is most likely a subpleural
lymph node.

3 mm right lower lobe subpleural pulmonary nodule on [DATE] also
represent a subpleural lymph node.

Left upper lobe pulmonary nodules of 3 mm on [DATE] and 2 mm on 34/8
are nonspecific.

ABDOMEN/PELVIS

Prostate: Multifocal prostatic tracer accumulation, most significant
in the left posterior prostate (likely the peripheral zone) mid to
apical gland at a S.U.V. max of 6.7.

Lymph nodes: No abnormal radiotracer accumulation within pelvic or
abdominal nodes.

Liver: No evidence of liver metastasis

Incidental CT finding: Multiple stones within a contracted
gallbladder. Normal adrenal glands. Abdominal aortic atherosclerosis
with nonaneurysmal dilatation of up to 3.2 cm on 164/4. Recommend
follow-up ultrasound every 3 years. This recommendation follows ACR
consensus guidelines: White Paper of the ACR Incidental Findings
Committee II on Vascular Findings. [HOSPITAL] 9297;
[DATE].

Retroaortic left renal vein.  No abdominopelvic adenopathy.

Fat containing left inguinal hernia.

SKELETON

No focal activity to suggest skeletal metastasis. Left sacral 1.2 cm
sclerotic lesion is not tracer avid and is favored to represent a
bone island. Note is made of a intramuscular lipoma within the right
trapezius on 83/4.
IMPRESSION: 1. Multifocal tracer accumulation within the prostate, likely the
site of primary or primaries.
2. No tracer avid soft tissue or osseous metastasis.
3. Tiny nonspecific pulmonary nodules, unlikely to represent
metastatic disease. This could be re-evaluated with chest CT at 6
months.
4. Incidental findings, including: Cholelithiasis. Aortic
Atherosclerosis ([id number]).

## 2023-02-17 ENCOUNTER — Encounter: Payer: Self-pay | Admitting: Internal Medicine

## 2023-03-05 NOTE — Progress Notes (Signed)
This encounter was created in error - please disregard.

## 2023-05-25 ENCOUNTER — Encounter (INDEPENDENT_AMBULATORY_CARE_PROVIDER_SITE_OTHER): Payer: Self-pay | Admitting: *Deleted
